# Patient Record
Sex: Male | Born: 1965 | Race: White | Hispanic: No | Marital: Married | Smoking: Former smoker
Health system: Southern US, Community
[De-identification: ages and names within clinical notes are randomized; demographics above are authoritative.]

## PROBLEM LIST (undated history)

## (undated) DIAGNOSIS — K219 Gastro-esophageal reflux disease without esophagitis: Secondary | ICD-10-CM

## (undated) DIAGNOSIS — F419 Anxiety disorder, unspecified: Secondary | ICD-10-CM

## (undated) DIAGNOSIS — F1910 Other psychoactive substance abuse, uncomplicated: Secondary | ICD-10-CM

## (undated) DIAGNOSIS — F32A Depression, unspecified: Secondary | ICD-10-CM

## (undated) DIAGNOSIS — Z8719 Personal history of other diseases of the digestive system: Secondary | ICD-10-CM

## (undated) HISTORY — PX: COLONOSCOPY: SHX174

## (undated) HISTORY — PX: FRACTURE SURGERY: SHX138

---

## 2015-01-04 ENCOUNTER — Other Ambulatory Visit: Payer: Self-pay | Admitting: Family Medicine

## 2015-01-04 DIAGNOSIS — D171 Benign lipomatous neoplasm of skin and subcutaneous tissue of trunk: Secondary | ICD-10-CM

## 2015-01-25 ENCOUNTER — Other Ambulatory Visit: Payer: Self-pay

## 2015-01-31 ENCOUNTER — Ambulatory Visit
Admission: RE | Admit: 2015-01-31 | Discharge: 2015-01-31 | Disposition: A | Discharge status: Home / Self Care | Payer: 59 | Source: Ambulatory Visit | Attending: Family Medicine | Admitting: Family Medicine

## 2015-01-31 DIAGNOSIS — D171 Benign lipomatous neoplasm of skin and subcutaneous tissue of trunk: Secondary | ICD-10-CM

## 2017-11-10 DIAGNOSIS — M189 Osteoarthritis of first carpometacarpal joint, unspecified: Secondary | ICD-10-CM | POA: Insufficient documentation

## 2018-06-30 ENCOUNTER — Encounter (HOSPITAL_COMMUNITY): Payer: Self-pay | Admitting: Emergency Medicine

## 2018-06-30 ENCOUNTER — Other Ambulatory Visit: Payer: Self-pay

## 2018-06-30 ENCOUNTER — Emergency Department (HOSPITAL_COMMUNITY)
Admission: EM | Admit: 2018-06-30 | Discharge: 2018-06-30 | Disposition: A | Discharge status: Home / Self Care | Payer: 59 | Attending: Emergency Medicine | Admitting: Emergency Medicine

## 2018-06-30 DIAGNOSIS — M7918 Myalgia, other site: Secondary | ICD-10-CM | POA: Insufficient documentation

## 2018-06-30 DIAGNOSIS — Y999 Unspecified external cause status: Secondary | ICD-10-CM | POA: Insufficient documentation

## 2018-06-30 DIAGNOSIS — Y9389 Activity, other specified: Secondary | ICD-10-CM | POA: Insufficient documentation

## 2018-06-30 DIAGNOSIS — Z87891 Personal history of nicotine dependence: Secondary | ICD-10-CM | POA: Diagnosis not present

## 2018-06-30 MED ORDER — IBUPROFEN 600 MG PO TABS: 600.0000 mg | ORAL_TABLET | Freq: Four times a day (QID) | ORAL | 0 refills | Status: DC | PRN

## 2018-06-30 MED ORDER — CYCLOBENZAPRINE HCL 10 MG PO TABS: 10.0000 mg | ORAL_TABLET | Freq: Two times a day (BID) | ORAL | 0 refills | Status: DC | PRN

## 2018-06-30 NOTE — ED Notes (Signed)
Bed: WTR7 Expected date:  Expected time:  Means of arrival:  Comments: 
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
## 2019-10-12 DIAGNOSIS — H93293 Other abnormal auditory perceptions, bilateral: Secondary | ICD-10-CM | POA: Insufficient documentation

## 2020-04-12 ENCOUNTER — Ambulatory Visit: Payer: No Typology Code available for payment source | Attending: Internal Medicine

## 2020-04-12 DIAGNOSIS — Z23 Encounter for immunization: Secondary | ICD-10-CM

## 2020-04-12 NOTE — Progress Notes (Signed)
   Covid-19 Vaccination Clinic  Name:  Johnny Mills    MRN: [id number] DOB: [birth year]  04/12/2020  Mr. Johnny Mills was observed post Covid-19 immunization for 15 minutes without incident. He was provided with Vaccine Information Sheet and instruction to access the V-Safe system.   Mr. Johnny Mills was instructed to call 911 with any severe reactions post vaccine: Marland Kitchen Difficulty breathing  . Swelling of face and throat  . A fast heartbeat  . A bad rash all over body  . Dizziness and weakness

## 2020-10-17 ENCOUNTER — Other Ambulatory Visit: Payer: Self-pay | Admitting: Gastroenterology

## 2020-10-17 DIAGNOSIS — R160 Hepatomegaly, not elsewhere classified: Secondary | ICD-10-CM

## 2020-11-01 ENCOUNTER — Other Ambulatory Visit: Payer: Self-pay

## 2020-11-01 ENCOUNTER — Ambulatory Visit
Admission: RE | Admit: 2020-11-01 | Discharge: 2020-11-01 | Disposition: A | Discharge status: Home / Self Care | Payer: BC Managed Care – PPO | Source: Ambulatory Visit | Attending: Gastroenterology | Admitting: Gastroenterology

## 2020-11-01 DIAGNOSIS — R160 Hepatomegaly, not elsewhere classified: Secondary | ICD-10-CM

## 2021-06-27 ENCOUNTER — Other Ambulatory Visit: Payer: Self-pay | Admitting: Gastroenterology

## 2021-06-27 DIAGNOSIS — R109 Unspecified abdominal pain: Secondary | ICD-10-CM

## 2021-06-27 DIAGNOSIS — K219 Gastro-esophageal reflux disease without esophagitis: Secondary | ICD-10-CM

## 2021-06-27 DIAGNOSIS — K449 Diaphragmatic hernia without obstruction or gangrene: Secondary | ICD-10-CM

## 2021-07-02 ENCOUNTER — Other Ambulatory Visit: Payer: BC Managed Care – PPO

## 2021-07-08 ENCOUNTER — Ambulatory Visit
Admission: RE | Admit: 2021-07-08 | Discharge: 2021-07-08 | Disposition: A | Discharge status: Home / Self Care | Payer: BC Managed Care – PPO | Source: Ambulatory Visit | Attending: Gastroenterology | Admitting: Gastroenterology

## 2021-07-08 DIAGNOSIS — K449 Diaphragmatic hernia without obstruction or gangrene: Secondary | ICD-10-CM

## 2021-07-08 DIAGNOSIS — R109 Unspecified abdominal pain: Secondary | ICD-10-CM

## 2021-07-08 DIAGNOSIS — K219 Gastro-esophageal reflux disease without esophagitis: Secondary | ICD-10-CM

## 2021-08-12 ENCOUNTER — Ambulatory Visit: Payer: Self-pay | Admitting: Surgery

## 2021-08-12 ENCOUNTER — Encounter: Payer: Self-pay | Admitting: Surgery

## 2021-08-12 DIAGNOSIS — F411 Generalized anxiety disorder: Secondary | ICD-10-CM | POA: Insufficient documentation

## 2021-08-12 DIAGNOSIS — F1920 Other psychoactive substance dependence, uncomplicated: Secondary | ICD-10-CM | POA: Insufficient documentation

## 2021-08-12 DIAGNOSIS — K219 Gastro-esophageal reflux disease without esophagitis: Secondary | ICD-10-CM | POA: Insufficient documentation

## 2021-08-12 DIAGNOSIS — R112 Nausea with vomiting, unspecified: Secondary | ICD-10-CM | POA: Insufficient documentation

## 2021-08-12 DIAGNOSIS — K440 Diaphragmatic hernia with obstruction, without gangrene: Secondary | ICD-10-CM | POA: Insufficient documentation

## 2021-08-12 DIAGNOSIS — K2270 Barrett's esophagus without dysplasia: Secondary | ICD-10-CM | POA: Insufficient documentation

## 2021-08-12 DIAGNOSIS — Z87891 Personal history of nicotine dependence: Secondary | ICD-10-CM | POA: Insufficient documentation

## 2021-08-12 DIAGNOSIS — E785 Hyperlipidemia, unspecified: Secondary | ICD-10-CM | POA: Insufficient documentation

## 2021-08-26 ENCOUNTER — Other Ambulatory Visit: Payer: Self-pay | Admitting: Gastroenterology

## 2021-09-18 ENCOUNTER — Encounter (HOSPITAL_COMMUNITY): Payer: Self-pay | Admitting: Gastroenterology

## 2021-09-18 NOTE — Progress Notes (Signed)
Attempted to obtain medical history via telephone, unable to reach at this time. I left a voicemail to return pre surgical testing department's phone call.  

## 2021-09-25 ENCOUNTER — Encounter (HOSPITAL_COMMUNITY): Admission: RE | Payer: Self-pay | Source: Home / Self Care

## 2021-09-25 ENCOUNTER — Ambulatory Visit (HOSPITAL_COMMUNITY)
Admission: RE | Admit: 2021-09-25 | Payer: BC Managed Care – PPO | Source: Home / Self Care | Admitting: Gastroenterology

## 2021-09-25 SURGERY — MANOMETRY, ESOPHAGUS
Anesthesia: Monitor Anesthesia Care | Laterality: Bilateral

## 2022-05-30 ENCOUNTER — Encounter (HOSPITAL_COMMUNITY): Payer: Self-pay

## 2022-05-30 ENCOUNTER — Other Ambulatory Visit: Payer: Self-pay

## 2022-05-30 ENCOUNTER — Emergency Department (HOSPITAL_COMMUNITY)
Admission: EM | Admit: 2022-05-30 | Discharge: 2022-05-30 | Disposition: A | Discharge status: Home / Self Care | Payer: 59 | Attending: Emergency Medicine | Admitting: Emergency Medicine

## 2022-05-30 DIAGNOSIS — I83892 Varicose veins of left lower extremities with other complications: Secondary | ICD-10-CM | POA: Diagnosis not present

## 2022-05-30 DIAGNOSIS — S81812A Laceration without foreign body, left lower leg, initial encounter: Secondary | ICD-10-CM | POA: Diagnosis not present

## 2022-05-30 DIAGNOSIS — S8992XA Unspecified injury of left lower leg, initial encounter: Secondary | ICD-10-CM | POA: Diagnosis present

## 2022-05-30 DIAGNOSIS — X58XXXA Exposure to other specified factors, initial encounter: Secondary | ICD-10-CM | POA: Diagnosis not present

## 2022-05-30 DIAGNOSIS — I83899 Varicose veins of unspecified lower extremities with other complications: Secondary | ICD-10-CM

## 2022-05-30 MED ORDER — LIDOCAINE-EPINEPHRINE (PF) 2 %-1:200000 IJ SOLN
10.0000 mL | Freq: Once | INTRAMUSCULAR | Status: AC
  Administered 2022-05-30: 10 mL
  Filled 2022-05-30: qty 20

## 2022-05-30 NOTE — Discharge Instructions (Addendum)
Suture removal in 7-8 days

## 2022-05-30 NOTE — ED Provider Triage Note (Signed)
Emergency Medicine Provider Triage Evaluation Note  Johnny Mills , a 56 y.o. male  was evaluated in triage.  Pt complains of ruptured vein.  Patient states he has multiple clusters of torturous veins in his lower legs.  Patient states he was toweling off after a shower when a vein close to the skin in his left lower leg just superior to the medial malleolus ruptured.  Patient wrapped the wound with a pressure dressing.  Upon unwrapping.  The wound is still actively bleeding.  Wound has been rewrapped with pressure dressing.  Patient does not take blood thinners.  Review of Systems  Positive: As above Negative: As above  Physical Exam  BP (!) 160/117 (BP Location: Right Arm)   Pulse (!) 104   Temp 98.3 F (36.8 C) (Oral)   Resp 18   Ht '5\' 11"'$  (1.803 m)   Wt 97.5 kg   SpO2 95%   BMI 29.99 kg/m  Gen:   Awake, no distress   Resp:  Normal effort  MSK:   Moves extremities without difficulty  Other:    Medical Decision Making  Medically screening exam initiated at 7:38 PM.  Appropriate orders placed.  Erique Kaser was informed that the remainder of the evaluation will be completed by another provider, this initial triage assessment does not replace that evaluation, and the importance of remaining in the ED until their evaluation is complete.     Dorothyann Peng, PA-C 05/30/22 1939

## 2022-05-30 NOTE — ED Provider Notes (Signed)
[facility] DEPT Provider Note   CSN: [id number] Arrival date & time: 05/30/22  1927     History  Chief Complaint  Patient presents with   Laceration    Johnny Mills is a 56 y.o. male.  Patient reports he was getting out of the shower and rubbed his leg with a towel.  Patient reports he has varicose veins in his lower leg and when he rubs a towel over his leg his leg began bleeding.  Patient reports he has been unable to get the area to stop bleeding at home.  Patient denies any previous history of varicose veins bleeding  The history is provided by the patient. No language interpreter was used.  Laceration Bleeding: uncontrolled   Laceration mechanism:  Unable to specify Pain details:    Quality:  Aching Relieved by:  Nothing Worsened by:  Nothing      Home Medications Prior to Admission medications   Medication Sig Start Date End Date Taking? Authorizing Provider  cyclobenzaprine (FLEXERIL) 10 MG tablet Take 1 tablet (10 mg total) by mouth 2 (two) times daily as needed for muscle spasms. 06/30/18   Domenic Moras, PA-C  ibuprofen (ADVIL,MOTRIN) 600 MG tablet Take 1 tablet (600 mg total) by mouth every 6 (six) hours as needed. 06/30/18   Domenic Moras, PA-C      Allergies    Duloxetine, Duloxetine hcl, Escitalopram, Fluoxetine, Penicillamine, Penicillins, and Sulfa antibiotics    Review of Systems   Review of Systems  All other systems reviewed and are negative.   Physical Exam Updated Vital Signs BP (!) 160/117 (BP Location: Right Arm)   Pulse (!) 104   Temp 98.3 F (36.8 C) (Oral)   Resp 18   Ht '5\' 11"'$  (1.803 m)   Wt 97.5 kg   SpO2 95%   BMI 29.99 kg/m  Physical Exam Vitals reviewed.  Constitutional:      Appearance: Normal appearance.  Skin:    Comments: Multiple varicose veins left lower leg.  Bleeding from small ruptured varicosity  Neurological:     General: No focal deficit present.     Mental Status: He is alert.   Psychiatric:        Mood and Affect: Mood normal.     ED Results / Procedures / Treatments   Labs (all labs ordered are listed, but only abnormal results are displayed) Labs Reviewed - No data to display  EKG None  Radiology No results found.  Procedures .Marland KitchenLaceration Repair  Date/Time: 05/30/2022 8:52 PM  Performed by: Fransico Meadow, PA-C Authorized by: Fransico Meadow, PA-C   Consent:    Consent obtained:  Verbal   Consent given by:  Patient   Risks, benefits, and alternatives were discussed: yes     Alternatives discussed:  No treatment Universal protocol:    Procedure explained and questions answered to patient or proxy's satisfaction: yes     Immediately prior to procedure, a time out was called: yes     Patient identity confirmed:  Verbally with patient Anesthesia:    Anesthesia method:  Local infiltration   Local anesthetic:  Lidocaine 1% WITH epi Laceration details:    Length (cm):  0.2 Pre-procedure details:    Preparation:  Patient was prepped and draped in usual sterile fashion Treatment:    Area cleansed with:  Povidone-iodine   Irrigation solution:  Sterile saline Skin repair:    Repair method:  Sutures   Suture size:  5-0   Suture  material:  Prolene   Suture technique:  Simple interrupted   Number of sutures: 1. Repair type:    Repair type:  Simple Post-procedure details:    Procedure completion:  Procedure terminated due to patient's clinical status     Medications Ordered in ED Medications  lidocaine-EPINEPHrine (XYLOCAINE W/EPI) 2 %-1:200000 (PF) injection 10 mL (10 mLs Infiltration Given by Other 05/30/22 2043)    ED Course/ Medical Decision Making/ A&P                           Medical Decision Making Risk Prescription drug management.           Final Clinical Impression(s) / ED Diagnoses Final diagnoses:  Ruptured varicosity    Rx / DC Orders ED Discharge Orders     None      An After Visit Summary was  printed and given to the patient.    Sidney Ace 05/30/22 2053    Drenda Freeze, MD 05/30/22 [phone]

## 2022-05-30 NOTE — ED Triage Notes (Signed)
Left medial ankle laceration after drying off with towel and showering.   Says a cluster of veins just bursted and unable to control bleeding.   Denies anticoagulants.  Dressing unwrapped in triage and immediate heavy bleeding. Unable to properly visualize laceration.

## 2023-11-08 ENCOUNTER — Emergency Department (HOSPITAL_BASED_OUTPATIENT_CLINIC_OR_DEPARTMENT_OTHER): Admitting: Radiology

## 2023-11-08 ENCOUNTER — Observation Stay (HOSPITAL_BASED_OUTPATIENT_CLINIC_OR_DEPARTMENT_OTHER)
Admission: EM | Admit: 2023-11-08 | Discharge: 2023-11-10 | Disposition: A | Discharge status: Home / Self Care | Attending: Internal Medicine | Admitting: Internal Medicine

## 2023-11-08 ENCOUNTER — Encounter (HOSPITAL_BASED_OUTPATIENT_CLINIC_OR_DEPARTMENT_OTHER): Payer: Self-pay | Admitting: Emergency Medicine

## 2023-11-08 ENCOUNTER — Other Ambulatory Visit: Payer: Self-pay

## 2023-11-08 DIAGNOSIS — K920 Hematemesis: Secondary | ICD-10-CM | POA: Diagnosis present

## 2023-11-08 DIAGNOSIS — R1111 Vomiting without nausea: Secondary | ICD-10-CM | POA: Diagnosis not present

## 2023-11-08 DIAGNOSIS — Z7982 Long term (current) use of aspirin: Secondary | ICD-10-CM | POA: Insufficient documentation

## 2023-11-08 DIAGNOSIS — K449 Diaphragmatic hernia without obstruction or gangrene: Secondary | ICD-10-CM | POA: Diagnosis not present

## 2023-11-08 DIAGNOSIS — K922 Gastrointestinal hemorrhage, unspecified: Secondary | ICD-10-CM | POA: Diagnosis not present

## 2023-11-08 DIAGNOSIS — K317 Polyp of stomach and duodenum: Secondary | ICD-10-CM | POA: Insufficient documentation

## 2023-11-08 DIAGNOSIS — K2270 Barrett's esophagus without dysplasia: Secondary | ICD-10-CM | POA: Diagnosis present

## 2023-11-08 DIAGNOSIS — R000 Tachycardia, unspecified: Secondary | ICD-10-CM | POA: Diagnosis not present

## 2023-11-08 DIAGNOSIS — Z79899 Other long term (current) drug therapy: Secondary | ICD-10-CM | POA: Diagnosis not present

## 2023-11-08 DIAGNOSIS — E860 Dehydration: Secondary | ICD-10-CM | POA: Diagnosis not present

## 2023-11-08 DIAGNOSIS — K219 Gastro-esophageal reflux disease without esophagitis: Secondary | ICD-10-CM | POA: Diagnosis present

## 2023-11-08 DIAGNOSIS — Z7401 Bed confinement status: Secondary | ICD-10-CM | POA: Diagnosis not present

## 2023-11-08 LAB — ABO/RH: ABO/RH(D): A NEG

## 2023-11-08 LAB — COMPREHENSIVE METABOLIC PANEL WITH GFR
ALT: 26 U/L (ref 0–44)
AST: 22 U/L (ref 15–41)
Albumin: 4.5 g/dL (ref 3.5–5.0)
Alkaline Phosphatase: 98 U/L (ref 38–126)
Anion gap: 15 (ref 5–15)
BUN: 19 mg/dL (ref 6–20)
CO2: 28 mmol/L (ref 22–32)
Calcium: 10.8 mg/dL — ABNORMAL HIGH (ref 8.9–10.3)
Chloride: 102 mmol/L (ref 98–111)
Creatinine, Ser: 1.06 mg/dL (ref 0.61–1.24)
GFR, Estimated: >60 mL/min (ref >60)
Glucose, Bld: 139 mg/dL — ABNORMAL HIGH (ref 70–99)
Potassium: 3.9 mmol/L (ref 3.5–5.1)
Sodium: 144 mmol/L (ref 135–145)
Total Bilirubin: 0.6 mg/dL (ref 0.0–1.2)
Total Protein: 7.8 g/dL (ref 6.5–8.1)

## 2023-11-08 LAB — URINALYSIS, ROUTINE W REFLEX MICROSCOPIC
Bilirubin Urine: NEGATIVE
Glucose, UA: NEGATIVE mg/dL
Hgb urine dipstick: NEGATIVE
Ketones, ur: 40 mg/dL — AB
Leukocytes,Ua: NEGATIVE
Nitrite: NEGATIVE
Protein, ur: 30 mg/dL — AB
Specific Gravity, Urine: 1.023 (ref 1.005–1.030)
pH: 8.5 — ABNORMAL HIGH (ref 5.0–8.0)

## 2023-11-08 LAB — CBC
HCT: 44.7 % (ref 39.0–52.0)
Hemoglobin: 15.2 g/dL (ref 13.0–17.0)
MCH: 30.9 pg (ref 26.0–34.0)
MCHC: 34 g/dL (ref 30.0–36.0)
MCV: 90.9 fL (ref 80.0–100.0)
Platelets: 325 10*3/uL (ref 150–400)
RBC: 4.92 MIL/uL (ref 4.22–5.81)
RDW: 12.8 % (ref 11.5–15.5)
WBC: 8.3 10*3/uL (ref 4.0–10.5)
nRBC: 0 % (ref 0.0–0.2)

## 2023-11-08 LAB — LIPASE, BLOOD: Lipase: 27 U/L (ref 11–51)

## 2023-11-08 MED ORDER — ONDANSETRON HCL 4 MG/2ML IJ SOLN
4.0000 mg | Freq: Once | INTRAMUSCULAR | Status: AC
  Administered 2023-11-08: 4 mg via INTRAVENOUS
  Filled 2023-11-08: qty 2

## 2023-11-08 MED ORDER — DEXTROSE-SODIUM CHLORIDE 5-0.9 % IV SOLN: INTRAVENOUS | Status: AC

## 2023-11-08 MED ORDER — SODIUM CHLORIDE 0.9 % IV BOLUS
1000.0000 mL | Freq: Once | INTRAVENOUS | Status: AC
  Administered 2023-11-08: 1000 mL via INTRAVENOUS

## 2023-11-08 MED ORDER — PANTOPRAZOLE SODIUM 40 MG IV SOLR
40.0000 mg | Freq: Once | INTRAVENOUS | Status: AC
  Administered 2023-11-08: 40 mg via INTRAVENOUS
  Filled 2023-11-08: qty 10

## 2023-11-08 MED ORDER — SODIUM CHLORIDE 0.9% FLUSH
3.0000 mL | Freq: Two times a day (BID) | INTRAVENOUS | Status: DC
  Administered 2023-11-08 – 2023-11-10 (×3): 3 mL via INTRAVENOUS

## 2023-11-08 MED ORDER — BUPROPION HCL ER (XL) 150 MG PO TB24
150.0000 mg | ORAL_TABLET | Freq: Every day | ORAL | Status: DC
  Administered 2023-11-09 – 2023-11-10 (×2): 150 mg via ORAL
  Filled 2023-11-08 (×2): qty 1

## 2023-11-08 MED ORDER — PANTOPRAZOLE SODIUM 40 MG IV SOLR
40.0000 mg | Freq: Two times a day (BID) | INTRAVENOUS | Status: DC
  Administered 2023-11-09 – 2023-11-10 (×3): 40 mg via INTRAVENOUS
  Filled 2023-11-08 (×3): qty 10

## 2023-11-08 NOTE — ED Notes (Signed)
 Pt ambulatory to the bathroom

## 2023-11-08 NOTE — ED Triage Notes (Signed)
 Black, coffee ground emesis several episodes. Started this AM. HX EtOH abuse in the past. Denies upper CP, SOB, and diarrhea. Some light-headed feelings.

## 2023-11-08 NOTE — ED Provider Notes (Signed)
  EMERGENCY DEPARTMENT AT [facility] Provider Note   CSN: [id number] Arrival date & time: 11/08/23  1715     History  Chief Complaint  Patient presents with   Hematemesis    Johnny Mills is a 58 y.o. male with history of hiatal hernia, presents with concern for throwing up black appearing emesis earlier today.  States this happened about 4 or 5 times, prompting him to come in for evaluation.  He does report feeling slightly lightheaded and dizzy.  He is not on any anticoagulation.  He denies any alcohol use or liver problems.  States this started after eating barbecue yesterday evening which did not sit well on his stomach, so he forced himself to throw up a couple times.  Emesis last night did not contain any blood.  Denies any recent NSAID use.  HPI     Home Medications Prior to Admission medications   Medication Sig Start Date End Date Taking? Authorizing Provider  cyclobenzaprine  (FLEXERIL ) 10 MG tablet Take 1 tablet (10 mg total) by mouth 2 (two) times daily as needed for muscle spasms. 06/30/18   Debbra Fairy, PA-C  ibuprofen  (ADVIL ,MOTRIN ) 600 MG tablet Take 1 tablet (600 mg total) by mouth every 6 (six) hours as needed. 06/30/18   Debbra Fairy, PA-C      Allergies    Duloxetine, Duloxetine hcl, Escitalopram, Fluoxetine, Penicillamine, Penicillins, and Sulfa antibiotics    Review of Systems   Review of Systems  Gastrointestinal:  Positive for abdominal pain and vomiting.    Physical Exam Updated Vital Signs BP (!) 144/89   Pulse 70   Temp 98 F (36.7 C)   Resp 12   SpO2 94%  Physical Exam Vitals and nursing note reviewed.  Constitutional:      General: He is not in acute distress.    Appearance: He is well-developed.     Comments: No active vomiting upon my evaluation of patient  HENT:     Head: Normocephalic and atraumatic.  Eyes:     Conjunctiva/sclera: Conjunctivae normal.  Cardiovascular:     Rate and Rhythm: Normal rate and regular  rhythm.     Heart sounds: No murmur heard. Pulmonary:     Effort: Pulmonary effort is normal. No respiratory distress.     Breath sounds: Normal breath sounds.  Abdominal:     Palpations: Abdomen is soft.     Tenderness: There is no abdominal tenderness.  Musculoskeletal:        General: No swelling.     Cervical back: Neck supple.  Skin:    General: Skin is warm and dry.     Capillary Refill: Capillary refill takes less than 2 seconds.  Neurological:     Mental Status: He is alert.  Psychiatric:        Mood and Affect: Mood normal.     ED Results / Procedures / Treatments   Labs (all labs ordered are listed, but only abnormal results are displayed) Labs Reviewed  COMPREHENSIVE METABOLIC PANEL WITH GFR - Abnormal; Notable for the following components:      Result Value   Glucose, Bld 139 (*)    Calcium 10.8 (*)    All other components within normal limits  URINALYSIS, ROUTINE W REFLEX MICROSCOPIC - Abnormal; Notable for the following components:   APPearance HAZY (*)    pH 8.5 (*)    Ketones, ur 40 (*)    Protein, ur 30 (*)    Bacteria, UA RARE (*)  All other components within normal limits  LIPASE, BLOOD  CBC  ABO/RH    EKG None  Radiology DG Chest 2 View Result Date: 11/08/2023 CLINICAL DATA:  Hematemesis EXAM: CHEST - 2 VIEW COMPARISON:  None Available. FINDINGS: Large hiatal hernia with internal fluid levels. The lungs are clear. Normal heart silhouette. No pleural effusion or pneumothorax. IMPRESSION: Large hiatal hernia with internal fluid levels. Electronically Signed   By: Juanetta Nordmann M.D.   On: 11/08/2023 20:44    Procedures Procedures    Medications Ordered in ED Medications  ondansetron (ZOFRAN) injection 4 mg (4 mg Intravenous Given 11/08/23 1757)  pantoprazole (PROTONIX) injection 40 mg (40 mg Intravenous Given 11/08/23 1802)    ED Course/ Medical Decision Making/ A&P                                 Medical Decision Making Amount and/or  Complexity of Data Reviewed Labs: ordered. Radiology: ordered.  Risk Prescription drug management. Decision regarding hospitalization.     Differential diagnosis includes but is not limited to peptic ulcer, Mallory-Weiss tear, variceal bleed, gastritis, blood loss anemia  ED Course:  Upon initial evaluation, patient is well-appearing, stable vitals.  He reported 5-6 episodes of black appearing emesis prior to arrival.  No active vomiting here but he does report nausea.  Reporting some epigastric abdominal pain, but abdomen soft and nontender to palpation.  No rebound or guarding. No recent alcohol use or history of varices, no bright red blood or active bleeding, no concern for variceal bleed at this time  Labs Ordered: I Ordered, and personally interpreted labs.  The pertinent results include:   CBC within normal limits.  Hemoglobin 15.2 CMP unremarkable.  Normal creatinine and LFTs Base within normal limits Urinalysis no nitrates or leukocytes  Imaging Studies ordered: I ordered imaging studies including chest x-ray I independently visualized the imaging with scope of interpretation limited to determining acute life threatening conditions related to emergency care. Imaging showed large hiatal hernia with internal fluid levels I agree with the radiologist interpretation   Cardiac Monitoring: / EKG: The patient was maintained on a cardiac monitor.  I personally viewed and interpreted the cardiac monitored which showed an underlying rhythm of: Normal sinus rhythm   Consultations Obtained: I requested consultation with the gastroenterologist Dr. Honey Lusty,  and discussed lab and imaging findings as well as pertinent plan - they recommend: Continuing patient on Protonix.  Recommend chest x-ray.  They will plan to see patient in consult at [facility] in the morning  Medications Given: Protonix Zofran  Upon re-evaluation, patient well-appearing with stable vitals. Hemoglobin  remains within normal limits, no concern for acute blood loss anemia. Reports he has had 2 more episodes of black appearing hematemesis here in the emergency room. Will plan on admission for patient with GI to consult in the morning. Stable at this time for transfer    Impression: Hematemesis  Disposition:  Admission with hospitalist Dr. [facility]    Record Review: External records from outside source obtained and reviewed including general surgery consult for hiatal hernia in 2023.  Does not appear that he follows with GI     This chart was dictated using voice recognition software, Dragon. Despite the best efforts of this provider to proofread and correct errors, errors may still occur which can change documentation meaning.          Final Clinical Impression(s) / ED Diagnoses  Final diagnoses:  None    Rx / DC Orders ED Discharge Orders     None         Rexie Catena, PA-C 11/08/23 2143    Hershel Los, MD 11/09/23 [phone]

## 2023-11-09 DIAGNOSIS — E860 Dehydration: Secondary | ICD-10-CM | POA: Insufficient documentation

## 2023-11-09 DIAGNOSIS — K922 Gastrointestinal hemorrhage, unspecified: Secondary | ICD-10-CM | POA: Diagnosis not present

## 2023-11-09 LAB — CBC
HCT: 41.1 % (ref 39.0–52.0)
Hemoglobin: 12.9 g/dL — ABNORMAL LOW (ref 13.0–17.0)
MCH: 30.3 pg (ref 26.0–34.0)
MCHC: 31.4 g/dL (ref 30.0–36.0)
MCV: 96.5 fL (ref 80.0–100.0)
Platelets: 237 10*3/uL (ref 150–400)
RBC: 4.26 MIL/uL (ref 4.22–5.81)
RDW: 12.8 % (ref 11.5–15.5)
WBC: 5.7 10*3/uL (ref 4.0–10.5)
nRBC: 0 % (ref 0.0–0.2)

## 2023-11-09 LAB — BASIC METABOLIC PANEL WITH GFR
Anion gap: 8 (ref 5–15)
BUN: 26 mg/dL — ABNORMAL HIGH (ref 6–20)
CO2: 26 mmol/L (ref 22–32)
Calcium: 9 mg/dL (ref 8.9–10.3)
Chloride: 105 mmol/L (ref 98–111)
Creatinine, Ser: 0.82 mg/dL (ref 0.61–1.24)
GFR, Estimated: >60 mL/min (ref >60)
Glucose, Bld: 125 mg/dL — ABNORMAL HIGH (ref 70–99)
Potassium: 4.2 mmol/L (ref 3.5–5.1)
Sodium: 139 mmol/L (ref 135–145)

## 2023-11-09 LAB — TYPE AND SCREEN
ABO/RH(D): A NEG
Antibody Screen: NEGATIVE

## 2023-11-09 LAB — PROTIME-INR
INR: 1.1 (ref 0.8–1.2)
Prothrombin Time: 14.7 s (ref 11.4–15.2)

## 2023-11-09 LAB — PHOSPHORUS: Phosphorus: 4.1 mg/dL (ref 2.5–4.6)

## 2023-11-09 LAB — MAGNESIUM: Magnesium: 2 mg/dL (ref 1.7–2.4)

## 2023-11-09 LAB — HIV ANTIBODY (ROUTINE TESTING W REFLEX): HIV Screen 4th Generation wRfx: NONREACTIVE

## 2023-11-09 NOTE — Anesthesia Preprocedure Evaluation (Signed)
 Anesthesia Evaluation  Patient identified by MRN, date of birth, ID band Patient awake    Reviewed: Allergy & Precautions, NPO status , Patient's Chart, lab work & pertinent test results  History of Anesthesia Complications Negative for: history of anesthetic complications  Airway Mallampati: I  TM Distance: >3 FB Neck ROM: Full    Dental no notable dental hx. (+) Teeth Intact, Dental Advisory Given   Pulmonary neg pulmonary ROS, former smoker   Pulmonary exam normal breath sounds clear to auscultation       Cardiovascular (-) hypertension(-) angina (-) Past MI negative cardio ROS Normal cardiovascular exam Rhythm:Regular Rate:Normal     Neuro/Psych  PSYCHIATRIC DISORDERS Anxiety        GI/Hepatic Neg liver ROS, hiatal hernia,GERD  Medicated and Controlled,,  Endo/Other  negative endocrine ROS    Renal/GU Lab Results      Component                Value               Date                             K                        4.2                 11/09/2023                CO2                      26                  11/09/2023                BUN                      26 (H)              11/09/2023                CREATININE               0.82                11/09/2023                    Musculoskeletal  (+) Arthritis ,    Abdominal   Peds  Hematology Lab Results      Component                Value               Date                      WBC                      5.7                 11/09/2023                HGB                      12.9 (L)            11/09/2023  HCT                      41.1                11/09/2023                MCV                      96.5                11/09/2023                PLT                      237                 11/09/2023              Anesthesia Other Findings All: Duloxetine, fluoxetine, Pcn, Sulfa, Escitalopram  Reproductive/Obstetrics                              Anesthesia Physical Anesthesia Plan  ASA: 2  Anesthesia Plan: MAC   Post-op Pain Management: Minimal or no pain anticipated   Induction:   PONV Risk Score and Plan: Treatment may vary due to age or medical condition and Propofol infusion  Airway Management Planned: Natural Airway and Nasal Cannula  Additional Equipment: None  Intra-op Plan:   Post-operative Plan:   Informed Consent: I have reviewed the patients History and Physical, chart, labs and discussed the procedure including the risks, benefits and alternatives for the proposed anesthesia with the patient or authorized representative who has indicated his/her understanding and acceptance.     Dental advisory given  Plan Discussed with: CRNA and Surgeon  Anesthesia Plan Comments: (Coffee ground emesis)       Anesthesia Quick Evaluation

## 2023-11-09 NOTE — Consult Note (Signed)
 [facility] Consult  Referring Provider: [facility] Primary Care Physician:  [facility] [place] Primary Gastroenterologist: Dr. Kimble Pennant  Reason for Consultation: Coffee-ground emesis  HPI: Johnny Mills is a 58 y.o. male with history of hiatal hernia and Barrett's esophagus who [facility] with nausea, vomiting and coffee-ground emesis.  Patient states he has multiple intermittent episodes of" gastritis", which she describes as epigastric and lower sternal pressure which improves with nausea and vomiting however he has never had vomiting of coffee-ground material. He denies noticing blood in stool or black stools. He denies acid reflux or heartburn. Denies difficulty swallowing or pain on swallowing. He is unintentional weight loss or loss of appetite. He takes pantoprazole 40 mg once a day. He also takes aspirin 81 mg a day but denies use of other NSAIDs, antiplatelets, Goody powders.   History reviewed. No pertinent past medical history.  Past Surgical History:  Procedure Laterality Date   FRACTURE SURGERY      Prior to Admission medications   Medication Sig Start Date End Date Taking? Authorizing Provider  aspirin EC 81 MG tablet Take 81 mg by mouth daily. Swallow whole.   Yes [provider]  buPROPion (WELLBUTRIN XL) 150 MG 24 hr tablet Take 150 mg by mouth every morning. 09/28/23  Yes [provider]  folic acid (FOLVITE) 1 MG tablet Take 1 mg by mouth daily.   Yes [provider]  L-ARGININE PO Take 1 Dose by mouth daily.   Yes [provider]  meloxicam (MOBIC) 15 MG tablet Take 15 mg by mouth daily. 10/05/23  Yes [provider]  Multiple Vitamins-Minerals (ZINC PO) Take 1 tablet by mouth daily.   Yes [provider]  naproxen sodium (ALEVE) 220 MG tablet Take 220-440 mg by mouth 2 (two) times daily as needed (Pain).   Yes [provider]  pantoprazole  (PROTONIX) 40 MG tablet Take 40 mg by mouth daily.   Yes [provider]  Saw Palmetto, Serenoa repens, (SAW PALMETTO PO) Take 1 Dose by mouth daily.   Yes [provider]    Current Facility-Administered Medications  Medication Dose Route Frequency Provider Last Rate Last Admin   buPROPion (WELLBUTRIN XL) 24 hr tablet 150 mg  150 mg Oral Daily Segars, Jonathan, MD       pantoprazole (PROTONIX) injection 40 mg  40 mg Intravenous Q12H Segars, Jonathan, MD   40 mg at 11/09/23 0536   sodium chloride flush (NS) 0.9 % injection 3 mL  3 mL Intravenous Q12H Arnulfo Larch, MD   3 mL at 11/08/23 2303    Allergies as of 11/08/2023 - Review Complete 11/08/2023  Allergen Reaction Noted   Duloxetine Other (See Comments) 06/27/2021   Duloxetine hcl Other (See Comments) 06/27/2021   Escitalopram Other (See Comments) 06/27/2021   Fluoxetine Other (See Comments) 06/27/2021   Penicillamine Other (See Comments) 08/12/2021   Penicillins Hives and Other (See Comments) 06/30/2018   Sulfa antibiotics Hives 10/12/2019    History reviewed. No pertinent family history.  Social History   Socioeconomic History   Marital status: Married    Spouse name: Not on file   Number of children: Not on file   Years of education: Not on file   Highest education level: Not on file  Occupational History   Not on file  Tobacco Use   Smoking status: Former   Smokeless tobacco: Never  Vaping Use   Vaping status: Never Used  Substance  and Sexual Activity   Alcohol use: Not on file   Drug use: Not on file   Sexual activity: Not on file  Other Topics Concern   Not on file  Social History Narrative   Not on file   Social Drivers of Health   Financial Resource Strain: Not on file  Food Insecurity: No Food Insecurity (11/08/2023)   Hunger Vital Sign    Worried About Running Out of Food in the Last Year: Never true    Ran Out of Food in the Last Year: Never true  Transportation Needs: No  Transportation Needs (11/08/2023)   PRAPARE - Administrator, Civil Service (Medical): No    Lack of Transportation (Non-Medical): No  Physical Activity: Not on file  Stress: Not on file  Social Connections: Not on file  Intimate Partner Violence: Not At Risk (11/08/2023)   Humiliation, Afraid, Rape, and Kick questionnaire    Fear of Current or Ex-Partner: No    Emotionally Abused: No    Physically Abused: No    Sexually Abused: No    Review of Systems: As per HPI.  Physical Exam: Vital signs in last 24 hours: Temp:  [98 F (36.7 C)-98.4 F (36.9 C)] 98 F (36.7 C) (06/09 1328) Pulse Rate:  [52-102] 63 (06/09 1328) Resp:  [10-20] 18 (06/09 1328) BP: (109-149)/(69-103) 114/70 (06/09 1328) SpO2:  [90 %-96 %] 94 % (06/09 1328) Weight:  [92.5 kg] 92.5 kg (06/08 2242) Last BM Date :  (PTA)  General:   Alert,  Well-developed, well-nourished, pleasant and cooperative in NAD Head:  Normocephalic and atraumatic. Eyes:  Sclera clear, no icterus.   Conjunctiva pink. Ears:  Normal auditory acuity. Nose:  No deformity, discharge,  or lesions. Mouth:  No deformity or lesions.  Oropharynx pink & moist. Neck:  Supple; no masses or thyromegaly. Lungs:  Clear throughout to auscultation.   No wheezes, crackles, or rhonchi. No acute distress. Heart:  Regular rate and rhythm; no murmurs, clicks, rubs,  or gallops. Extremities:  Without clubbing or edema. Neurologic:  Alert and  oriented x4;  grossly normal neurologically. Skin:  Intact without significant lesions or rashes. Psych:  Alert and cooperative. Normal mood and affect. Abdomen:  Soft, nontender and nondistended. No masses, hepatosplenomegaly or hernias noted. Normal bowel sounds, without guarding, and without rebound.         Lab Results: Recent Labs    11/08/23 1742 11/09/23 0547  WBC 8.3 5.7  HGB 15.2 12.9*  HCT 44.7 41.1  PLT 325 237   BMET Recent Labs    11/08/23 1742 11/09/23 0547  NA 144 139  K 3.9 4.2   CL 102 105  CO2 28 26  GLUCOSE 139* 125*  BUN 19 26*  CREATININE 1.06 0.82  CALCIUM 10.8* 9.0   LFT Recent Labs    11/08/23 1742  PROT 7.8  ALBUMIN 4.5  AST 22  ALT 26  ALKPHOS 98  BILITOT 0.6   PT/INR Recent Labs    11/09/23 0547  LABPROT 14.7  INR 1.1    Studies/Results: DG Chest 2 View Result Date: 11/08/2023 CLINICAL DATA:  Hematemesis EXAM: CHEST - 2 VIEW COMPARISON:  None Available. FINDINGS: Large hiatal hernia with internal fluid levels. The lungs are clear. Normal heart silhouette. No pleural effusion or pneumothorax. IMPRESSION: Large hiatal hernia with internal fluid levels. Electronically Signed   By: Juanetta Nordmann M.D.   On: 11/08/2023 20:44    Impression: Coffee-ground emesis Elevated BUN/creatinine ratio  of 26/0.82 Hemoglobin 12.9, was 15.2 on admission History of large hiatal hernia  Plan: Diagnostic EGD in a.m.Aaron Aas Clear liquid diet today, n.p.o. postmidnight. Continue pantoprazole 40 mg every 12 hours for now. He is no longer nauseous and has not needed Zofran since yesterday evening.   LOS: 0 days   Genell Ken, MD  11/09/2023, 2:35 PM

## 2023-11-09 NOTE — Plan of Care (Signed)
   Problem: Clinical Measurements: Goal: Ability to maintain clinical measurements within normal limits will improve Outcome: Progressing Goal: Will remain free from infection Outcome: Progressing Goal: Diagnostic test results will improve Outcome: Progressing   Problem: Activity: Goal: Risk for activity intolerance will decrease Outcome: Progressing

## 2023-11-09 NOTE — Progress Notes (Signed)
  [facility] Progress Note  Patient: Johnny Mills     WGN:[id number]  DOA: 11/08/2023   PCP: [facility] [place]       Brief hospital course: 58 y/o male with hiatal hernia, Barrett's esophagus, obesity who has bouts of vomiting every few months. He states rich food and eating too much trigger them. This time he ate at a barbecue and developed vomiting. He eventually developed hematemesis and came to the ED.   Subjective:  4 episodes of bloody vomitus since yesterday but none today. No abdominal pain.   Assessment and Plan: Principal Problem:   Upper GI bleed - h/o Barretts, Hiatal hernia and intermittent Meloxicam use - started as vomiting w/o blood but then had 4 episodes of bloody vomitus - Protonix IV BID - GI consulted- plan for EGD tomorrow  Active Problems:   Dehydration  - given 1 L NS and then D5NS at 75 cc/hr - is not on clear liquid diet. Will hold IVF.   Body mass index is 28.44 kg/m.     Code Status: Full Code Total time on patient care: 35 min DVT prophylaxis:  SCDs Start: 11/08/23 2244     Objective:   Vitals:   11/08/23 2242 11/09/23 0520 11/09/23 1025 11/09/23 1328  BP:  109/69 122/79 114/70  Pulse:  61 (!) 52 63  Resp:  16 20 18   Temp:  98 F (36.7 C) 98 F (36.7 C) 98 F (36.7 C)  TempSrc:  Oral Oral Oral  SpO2:  91% 94% 94%  Weight: 92.5 kg     Height: 5\' 11"  (1.803 m)      Filed Weights   11/08/23 2242  Weight: 92.5 kg   Exam: General exam: Appears comfortable  HEENT: oral mucosa moist Respiratory system: Clear to auscultation.  Cardiovascular system: S1 & S2 heard  Gastrointestinal system: Abdomen soft, mild epigastric tenderness, nondistended. Normal bowel sounds   Extremities: No cyanosis, clubbing or edema Psychiatry:  Mood & affect appropriate.      CBC: Recent Labs  Lab 11/08/23 1742 11/09/23 0547  WBC 8.3 5.7  HGB 15.2 12.9*  HCT 44.7 41.1  MCV 90.9 96.5  PLT 325 237   Basic  Metabolic Panel: Recent Labs  Lab 11/08/23 1742 11/09/23 0547  NA 144 139  K 3.9 4.2  CL 102 105  CO2 28 26  GLUCOSE 139* 125*  BUN 19 26*  CREATININE 1.06 0.82  CALCIUM 10.8* 9.0  MG  --  2.0  PHOS  --  4.1     Scheduled Meds:  buPROPion  150 mg Oral Daily   pantoprazole (PROTONIX) IV  40 mg Intravenous Q12H   sodium chloride flush  3 mL Intravenous Q12H    Imaging and lab data personally reviewed   Author: Jann Milkovich  11/09/2023 4:58 PM  To contact [facility]>   Check the care team in [facility] and look for the attending/consulting [facility] provider listed  Log into [email or web address] and use [facility]'s universal password   Go to> "[facility]"  and find provider  If you still have difficulty reaching the provider, please page the [facility] (Director on Call) for the Hospitalists listed on amion

## 2023-11-09 NOTE — H&P (Addendum)
 History and Physical    Lindley Hiney ZOX:[id number] DOB: Sep 11, 1965 DOA: 11/08/2023  PCP: [facility] [place]   Patient coming from: Transfer from [facility] ED   Chief Complaint:  Chief Complaint  Patient presents with   Hematemesis    HPI:  Johnny Mills is a 58 y.o. male with hx of hiatal hernia, Barrett's esophagus, who is transferred from [facility] ED with upper GI bleeding.  Patient reports yesterday evening he was at a barbecue and he ate some food which did not agree with him, developed nausea and vomiting.  Reports with his hiatal hernia he often has to vomit multiple times to completely clear his stomach.  He vomited about 5-6 times before developing black coffee-ground material in his emesis but continued vomiting.  In total thinks he vomited about 15 times.  No bright red blood, no melena.  Denies prior episodes of upper GI bleed.  His last endoscopy was in 3/' 23 done at outside facility and per notes demonstrated a large paraesophageal hiatal hernia, Barrett's esophagus.  He takes pantoprazole 40 mg daily for reflux/Barrett's.  Does take intermittent NSAIDs and has a prescription for meloxicam.  Takes aspirin 81 mg daily for primary prevention.  Takes a number of supplements including saw palmetto.   Review of Systems:  ROS complete and negative except as marked above   Allergies  Allergen Reactions   Duloxetine Other (See Comments)   Duloxetine Hcl Other (See Comments)    fatigue   Escitalopram Other (See Comments)   Fluoxetine Other (See Comments)    fatigue   Penicillamine Other (See Comments)   Penicillins Hives and Other (See Comments)   Sulfa Antibiotics Hives    Prior to Admission medications   Medication Sig Start Date End Date Taking? Authorizing Provider  cyclobenzaprine  (FLEXERIL ) 10 MG tablet Take 1 tablet (10 mg total) by mouth 2 (two) times daily as needed for muscle spasms. 06/30/18   Debbra Fairy, PA-C  ibuprofen  (ADVIL ,MOTRIN ) 600 MG tablet  Take 1 tablet (600 mg total) by mouth every 6 (six) hours as needed. 06/30/18   Debbra Fairy, PA-C    History reviewed. No pertinent past medical history.  Past Surgical History:  Procedure Laterality Date   FRACTURE SURGERY       reports that he has quit smoking. He has never used smokeless tobacco. No history on file for alcohol use and drug use.  History reviewed. No pertinent family history.   Physical Exam: Vitals:   11/08/23 2200 11/08/23 2210 11/08/23 2237 11/08/23 2242  BP: 139/84  (!) 149/96   Pulse: 72  81   Resp: 19  16   Temp:   98.3 F (36.8 C)   TempSrc:   Oral   SpO2: 90% 94% 91%   Weight:    92.5 kg  Height:    5\' 11"  (1.803 m)    Gen: Awake, alert, NAD   CV: Regular, normal S1, S2, no murmurs  Resp: Normal WOB, CTAB  Abd: Flat, normoactive, nontender, mild epigastric tenderness MSK: Symmetric, no edema  Skin: No rashes or lesions to exposed skin  Neuro: Alert and interactive  Psych: euthymic, appropriate    Data review:   Labs reviewed, notable for:   Hemoglobin 15 UA positive ketones  Micro:  No results found for this or any previous visit.  Imaging reviewed:  DG Chest 2 View Result Date: 11/08/2023 CLINICAL DATA:  Hematemesis EXAM: CHEST - 2 VIEW COMPARISON:  None Available. FINDINGS: Large hiatal  hernia with internal fluid levels. The lungs are clear. Normal heart silhouette. No pleural effusion or pneumothorax. IMPRESSION: Large hiatal hernia with internal fluid levels. Electronically Signed   By: Juanetta Nordmann M.D.   On: 11/08/2023 20:44    Personally reviewed chest x-ray   ED Course:  Outside EDP discussed case with [facility] Dr. Honey Lusty, [facility] to see in AM.  Treated with pantoprazole IV, Zofran   Assessment/Plan:  58 y.o. male with hx hiatal hernia, Barrett's esophagus, who is transferred from [facility] ED with upper GI bleeding.   Upper GI bleed Acute onset of coffee-ground emesis after multiple bouts of vomiting.  Takes aspirin  for primary prophylaxis and occasional NSAIDs.  Takes saw palmetto.  Hemodynamically stable.  Initial hemoglobin 15.  Etiology of bleed possibly gastritis/PUD with his aspirin/NSAID use, hiatal hernia, possible Mallory-Weiss with onset after vomiting.   - Outside EDP discussed case with [facility] Dr. Honey Lusty, [facility] to see in AM. - N.p.o. after midnight  - Continue pantoprazole 40 mg IV twice daily - Trend CBC, check coags, type and screen in a.m. - Advised to discontinue aspirin since used for primary prophylaxis - Advised to avoid NSAID medications - Doubt saw palmetto contributory but would advise on potential increased risk of bleeding  Dehydration Suspect fasting ketosis -Give 1 L NS, continue D5 NS 75 cc an hour after  Chronic medical problems: Hiatal hernia, Barrett's: Will be assessed on endoscopy, will need follow-up with Barrett's outpatient. Mood disorder: Continue home bupropion.   Body mass index is 28.44 kg/m.    DVT prophylaxis:  SCDs Code Status:  Full Code Diet:  Diet Orders (From admission, onward)     Start     Ordered   11/09/23 0001  Diet NPO time specified Except for: Sips with Meds  Diet effective midnight       Question:  Except for  Answer:  Bula Carney with Meds   11/08/23 2246           Family Communication:  None   Consults:  [facility]   Admission status:   Observation, Telemetry bed  Severity of Illness: The appropriate patient status for this patient is OBSERVATION. Observation status is judged to be reasonable and necessary in order to provide the required intensity of service to ensure the patient's safety. The patient's presenting symptoms, physical exam findings, and initial radiographic and laboratory data in the context of their medical condition is felt to place them at decreased risk for further clinical deterioration. Furthermore, it is anticipated that the patient will be medically stable for discharge from the hospital within 2 midnights of  admission.    Arnulfo Larch, MD [facility]  How to contact the [facility] Attending or Consulting provider 7A - 7P or covering provider during after hours 7P -7A, for this patient.  Check the care team in [facility] and look for a) attending/consulting [facility] provider listed and b) the [facility] team listed Log into [email or web address] and use [facility]'s universal password to access. If you do not have the password, please contact the hospital operator. Locate the [facility] provider you are looking for under [facility] and page to a number that you can be directly reached. If you still have difficulty reaching the provider, please page the [facility] (Director on Call) for the Hospitalists listed on amion for assistance.  11/09/2023, 12:15 AM

## 2023-11-09 NOTE — H&P (View-Only) (Signed)
 [facility] Consult  Referring Provider: [facility] Primary Care Physician:  [facility] [place] Primary Gastroenterologist: Dr. Kimble Pennant  Reason for Consultation: Coffee-ground emesis  HPI: Johnny Mills is a 58 y.o. male with history of hiatal hernia and Barrett's esophagus who [facility] with nausea, vomiting and coffee-ground emesis.  Patient states he has multiple intermittent episodes of" gastritis", which she describes as epigastric and lower sternal pressure which improves with nausea and vomiting however he has never had vomiting of coffee-ground material. He denies noticing blood in stool or black stools. He denies acid reflux or heartburn. Denies difficulty swallowing or pain on swallowing. He is unintentional weight loss or loss of appetite. He takes pantoprazole 40 mg once a day. He also takes aspirin 81 mg a day but denies use of other NSAIDs, antiplatelets, Goody powders.   History reviewed. No pertinent past medical history.  Past Surgical History:  Procedure Laterality Date   FRACTURE SURGERY      Prior to Admission medications   Medication Sig Start Date End Date Taking? Authorizing Provider  aspirin EC 81 MG tablet Take 81 mg by mouth daily. Swallow whole.   Yes [provider]  buPROPion (WELLBUTRIN XL) 150 MG 24 hr tablet Take 150 mg by mouth every morning. 09/28/23  Yes [provider]  folic acid (FOLVITE) 1 MG tablet Take 1 mg by mouth daily.   Yes [provider]  L-ARGININE PO Take 1 Dose by mouth daily.   Yes [provider]  meloxicam (MOBIC) 15 MG tablet Take 15 mg by mouth daily. 10/05/23  Yes [provider]  Multiple Vitamins-Minerals (ZINC PO) Take 1 tablet by mouth daily.   Yes [provider]  naproxen sodium (ALEVE) 220 MG tablet Take 220-440 mg by mouth 2 (two) times daily as needed (Pain).   Yes [provider]  pantoprazole  (PROTONIX) 40 MG tablet Take 40 mg by mouth daily.   Yes [provider]  Saw Palmetto, Serenoa repens, (SAW PALMETTO PO) Take 1 Dose by mouth daily.   Yes [provider]    Current Facility-Administered Medications  Medication Dose Route Frequency Provider Last Rate Last Admin   buPROPion (WELLBUTRIN XL) 24 hr tablet 150 mg  150 mg Oral Daily Segars, Jonathan, MD       pantoprazole (PROTONIX) injection 40 mg  40 mg Intravenous Q12H Segars, Jonathan, MD   40 mg at 11/09/23 0536   sodium chloride flush (NS) 0.9 % injection 3 mL  3 mL Intravenous Q12H Arnulfo Larch, MD   3 mL at 11/08/23 2303    Allergies as of 11/08/2023 - Review Complete 11/08/2023  Allergen Reaction Noted   Duloxetine Other (See Comments) 06/27/2021   Duloxetine hcl Other (See Comments) 06/27/2021   Escitalopram Other (See Comments) 06/27/2021   Fluoxetine Other (See Comments) 06/27/2021   Penicillamine Other (See Comments) 08/12/2021   Penicillins Hives and Other (See Comments) 06/30/2018   Sulfa antibiotics Hives 10/12/2019    History reviewed. No pertinent family history.  Social History   Socioeconomic History   Marital status: Married    Spouse name: Not on file   Number of children: Not on file   Years of education: Not on file   Highest education level: Not on file  Occupational History   Not on file  Tobacco Use   Smoking status: Former   Smokeless tobacco: Never  Vaping Use   Vaping status: Never Used  Substance  and Sexual Activity   Alcohol use: Not on file   Drug use: Not on file   Sexual activity: Not on file  Other Topics Concern   Not on file  Social History Narrative   Not on file   Social Drivers of Health   Financial Resource Strain: Not on file  Food Insecurity: No Food Insecurity (11/08/2023)   Hunger Vital Sign    Worried About Running Out of Food in the Last Year: Never true    Ran Out of Food in the Last Year: Never true  Transportation Needs: No  Transportation Needs (11/08/2023)   PRAPARE - Administrator, Civil Service (Medical): No    Lack of Transportation (Non-Medical): No  Physical Activity: Not on file  Stress: Not on file  Social Connections: Not on file  Intimate Partner Violence: Not At Risk (11/08/2023)   Humiliation, Afraid, Rape, and Kick questionnaire    Fear of Current or Ex-Partner: No    Emotionally Abused: No    Physically Abused: No    Sexually Abused: No    Review of Systems: As per HPI.  Physical Exam: Vital signs in last 24 hours: Temp:  [98 F (36.7 C)-98.4 F (36.9 C)] 98 F (36.7 C) (06/09 1328) Pulse Rate:  [52-102] 63 (06/09 1328) Resp:  [10-20] 18 (06/09 1328) BP: (109-149)/(69-103) 114/70 (06/09 1328) SpO2:  [90 %-96 %] 94 % (06/09 1328) Weight:  [92.5 kg] 92.5 kg (06/08 2242) Last BM Date :  (PTA)  General:   Alert,  Well-developed, well-nourished, pleasant and cooperative in NAD Head:  Normocephalic and atraumatic. Eyes:  Sclera clear, no icterus.   Conjunctiva pink. Ears:  Normal auditory acuity. Nose:  No deformity, discharge,  or lesions. Mouth:  No deformity or lesions.  Oropharynx pink & moist. Neck:  Supple; no masses or thyromegaly. Lungs:  Clear throughout to auscultation.   No wheezes, crackles, or rhonchi. No acute distress. Heart:  Regular rate and rhythm; no murmurs, clicks, rubs,  or gallops. Extremities:  Without clubbing or edema. Neurologic:  Alert and  oriented x4;  grossly normal neurologically. Skin:  Intact without significant lesions or rashes. Psych:  Alert and cooperative. Normal mood and affect. Abdomen:  Soft, nontender and nondistended. No masses, hepatosplenomegaly or hernias noted. Normal bowel sounds, without guarding, and without rebound.         Lab Results: Recent Labs    11/08/23 1742 11/09/23 0547  WBC 8.3 5.7  HGB 15.2 12.9*  HCT 44.7 41.1  PLT 325 237   BMET Recent Labs    11/08/23 1742 11/09/23 0547  NA 144 139  K 3.9 4.2   CL 102 105  CO2 28 26  GLUCOSE 139* 125*  BUN 19 26*  CREATININE 1.06 0.82  CALCIUM 10.8* 9.0   LFT Recent Labs    11/08/23 1742  PROT 7.8  ALBUMIN 4.5  AST 22  ALT 26  ALKPHOS 98  BILITOT 0.6   PT/INR Recent Labs    11/09/23 0547  LABPROT 14.7  INR 1.1    Studies/Results: DG Chest 2 View Result Date: 11/08/2023 CLINICAL DATA:  Hematemesis EXAM: CHEST - 2 VIEW COMPARISON:  None Available. FINDINGS: Large hiatal hernia with internal fluid levels. The lungs are clear. Normal heart silhouette. No pleural effusion or pneumothorax. IMPRESSION: Large hiatal hernia with internal fluid levels. Electronically Signed   By: Juanetta Nordmann M.D.   On: 11/08/2023 20:44    Impression: Coffee-ground emesis Elevated BUN/creatinine ratio  of 26/0.82 Hemoglobin 12.9, was 15.2 on admission History of large hiatal hernia  Plan: Diagnostic EGD in a.m.Aaron Aas Clear liquid diet today, n.p.o. postmidnight. Continue pantoprazole 40 mg every 12 hours for now. He is no longer nauseous and has not needed Zofran since yesterday evening.   LOS: 0 days   Genell Ken, MD  11/09/2023, 2:35 PM

## 2023-11-10 ENCOUNTER — Observation Stay (HOSPITAL_COMMUNITY): Payer: Self-pay | Admitting: Anesthesiology

## 2023-11-10 ENCOUNTER — Encounter (HOSPITAL_COMMUNITY): Payer: Self-pay | Admitting: Internal Medicine

## 2023-11-10 ENCOUNTER — Encounter (HOSPITAL_COMMUNITY)
Admission: EM | Disposition: A | Discharge status: Home / Self Care | Payer: Self-pay | Source: Home / Self Care | Attending: Emergency Medicine

## 2023-11-10 ENCOUNTER — Observation Stay (HOSPITAL_BASED_OUTPATIENT_CLINIC_OR_DEPARTMENT_OTHER): Payer: Self-pay | Admitting: Anesthesiology

## 2023-11-10 DIAGNOSIS — K449 Diaphragmatic hernia without obstruction or gangrene: Secondary | ICD-10-CM | POA: Diagnosis not present

## 2023-11-10 DIAGNOSIS — K317 Polyp of stomach and duodenum: Secondary | ICD-10-CM

## 2023-11-10 DIAGNOSIS — K920 Hematemesis: Secondary | ICD-10-CM | POA: Diagnosis not present

## 2023-11-10 DIAGNOSIS — K922 Gastrointestinal hemorrhage, unspecified: Secondary | ICD-10-CM | POA: Diagnosis not present

## 2023-11-10 SURGERY — EGD (ESOPHAGOGASTRODUODENOSCOPY)
Anesthesia: Monitor Anesthesia Care

## 2023-11-10 MED ORDER — LIDOCAINE 2% (20 MG/ML) 5 ML SYRINGE
INTRAMUSCULAR | Status: DC | PRN
  Administered 2023-11-10: 80 mg via INTRAVENOUS

## 2023-11-10 MED ORDER — PROPOFOL 10 MG/ML IV BOLUS
INTRAVENOUS | Status: DC | PRN
  Administered 2023-11-10 (×3): 50 mg via INTRAVENOUS

## 2023-11-10 MED ORDER — SODIUM CHLORIDE 0.9 % IV SOLN: INTRAVENOUS | Status: DC

## 2023-11-10 MED ORDER — PROPOFOL 500 MG/50ML IV EMUL
INTRAVENOUS | Status: DC | PRN
  Administered 2023-11-10: 100 ug/kg/min via INTRAVENOUS

## 2023-11-10 MED ORDER — MELOXICAM 15 MG PO TABS: 15.0000 mg | ORAL_TABLET | Freq: Every day | ORAL | Status: DC | PRN

## 2023-11-10 MED ORDER — SODIUM CHLORIDE 0.9 % IV SOLN: INTRAVENOUS | Status: DC | PRN

## 2023-11-10 NOTE — Transfer of Care (Signed)
 Immediate Anesthesia Transfer of Care Note  Patient: Thaddeaus Monica  Procedure(s) Performed: EGD (ESOPHAGOGASTRODUODENOSCOPY)  Patient Location: PACU and Endoscopy Unit  Anesthesia Type:MAC  Level of Consciousness: awake and alert   Airway & Oxygen Therapy: Patient Spontanous Breathing and Patient connected to nasal cannula oxygen  Post-op Assessment: Report given to RN and Post -op Vital signs reviewed and stable  Post vital signs: Reviewed and stable  Last Vitals:  Vitals Value Taken Time  BP    Temp    Pulse    Resp    SpO2      Last Pain:  Vitals:   11/10/23 1313  TempSrc: Temporal  PainSc: 0-No pain         Complications: No notable events documented.

## 2023-11-10 NOTE — Discharge Instructions (Signed)
 Please avoid daily use of aspirin, meloxicam, naproxen and only use occasionally. Please return to general surgeon to discuss surgery for your large hiatal hernia.  You were cared for by a hospitalist during your hospital stay. Please review all of you discharge paperwork on the day of discharge and be sure you have all of your prescribed medications and please read the below instructions:  Once you are discharged, your primary care physician will handle any further medical issues. Please note that NO REFILLS for any discharge medications will be authorized once you are discharged as it is imperative that you return to your primary care physician (or establish a relationship with a primary care physician if you do not have one) for your aftercare needs. Please obtain a follow up appointment with your primary care physician within 1-2 weeks of discharge. Please take all your medications with you for your next visit with your Primary MD. Please request your Primary MD to go over all Hospital Tests and Procedures, Radiological results at the follow up appointment. In some cases, there will be blood work, cultures and biopsy results pending at the time of your discharge. Please request that your primary care M.D. goes through all the records of your hospital data and follows up on these results. Please get all hospital records sent to your primary MD by signing hospital release before you go home or request your primary care doctor's office to assist with obtaining medical records.   You must read complete instructions/literature along with all the possible adverse reactions/side effects for all the medicines that have been prescribed to you. Take any new medicines after you have completely understood and accpet all the possible adverse reactions/side effects.  Please take medications as prescribed and speak with your doctor if changes are needed.   If you have smoked or chewed tobacco in the last 2 yrs  please stop. Stop any regular alcohol  and or any recreational drug use. Wear Seat belts while driving.   If you had Pneumonia at the Hospital: Please get a 2 view Chest X ray done in 6-8 weeks after hospital discharge or sooner if instructed by your Primary MD.   If you have Congestive Heart Failure: Follow a cardiac low salt diet and 1.5 lit/day fluid restriction. Please call your Cardiologist or Primary MD anytime you have any of the following symptoms:  1) 3 pound weight gain in 24 hours or 5 pounds in 1 week  2) shortness of breath, with or without a dry hacking cough  3) increasing swelling in the feet or stomach  4) if you have to sleep on extra pillows at night in order to breathe   If you have Diabetes: Check blood sugars 4 times/day- once on AM empty stomach and then before each meal. Log in all results and show them to your primary doctor at your next visit. If glucose readings are often under 60 or above 400 call your primary MD to see if medication dosages need to be adjusted   If you have Syncope (passing out) or Seizure/Convulsions/Epilepsy: Please do not drive, operate heavy machinery, participate in activities at heights or participate in high speed sports until you have seen by Primary MD or a Neurologist and advised to do so again. Per [place] statutes, patients with seizures are not allowed to drive until they have been seizure-free for six months.  Use caution when using heavy equipment or power tools. Avoid working on ladders or at heights. Take  showers instead of baths. Ensure the water temperature is not too high on the home water heater. Do not go swimming alone. Do not lock yourself in a room alone (i.e. bathroom). When caring for infants or small children, sit down when holding, feeding, or changing them to minimize risk of injury to the child in the event you have a seizure. Maintain good sleep hygiene. Avoid alcohol.    If you had Gastrointestinal  Bleeding: Please ask your Primary MD to check a complete blood count within one week of discharge or at your next visit. Your endoscopic/colonoscopic biopsies that are pending at the time of discharge will also need to followed by your Primary MD.    Johnny Mills can reach the hospitalist office at phone [phone] or fax [phone]   If you do not have a primary care physician, you can call [phone] for a physician referral.

## 2023-11-10 NOTE — Op Note (Signed)
 [facility] Patient Name: Johnny Mills Procedure Date: 11/10/2023 MRN: [id number] Attending MD: Genell Ken , MD, [id number] Date of Birth: 1965/09/28 CSN: [id number] Age: 58 Admit Type: Inpatient Procedure:                Upper GI endoscopy Indications:              Coffee-ground emesis Providers:                Genell Ken, MD, Golda Latch, RN, Tyrus Gallus, Technician Referring MD:             [facility] Medicines:                Monitored Anesthesia Care Complications:            No immediate complications. Estimated Blood Loss:     Estimated blood loss: none. Procedure:                Pre-Anesthesia Assessment:                           - Prior to the procedure, a History and Physical                            was performed, and patient medications and                            allergies were reviewed. The patient's tolerance of                            previous anesthesia was also reviewed. The risks                            and benefits of the procedure and the sedation                            options and risks were discussed with the patient.                            All questions were answered, and informed consent                            was obtained. Prior Anticoagulants: The patient has                            taken no anticoagulant or antiplatelet agents. ASA                            Grade Assessment: II - A patient with mild systemic                            disease. After reviewing the risks and benefits,  the patient was deemed in satisfactory condition to                            undergo the procedure.                           After obtaining informed consent, the endoscope was                            passed under direct vision. Throughout the                            procedure, the patient's blood pressure, pulse, and                            oxygen  saturations were monitored continuously. The                            [id number]-H190 ([id number]) Olympus endoscope was introduced                            through the mouth, and advanced to the second part                            of duodenum. The upper GI endoscopy was                            accomplished without difficulty. The patient                            tolerated the procedure well. Scope In: Scope Out: Findings:      The examined esophagus was normal.      The Z-line was found 40 cm from the incisors.      An extremely large hiatal hernia was present. It is possible that the       entire stomach is intrathoracic.      It was extremely difficult to advance the scope towards the pylorus and       into the duodenum due to significant looping of the scope within the       hiatal hernia.      The gastric antrum, prepyloric region of the stomach and pylorus were       normal.      A single large pedunculated polyp (thick base) was found in the second       to third portion of the duodenum.      It had a smooth surface, appeared to be lipomatous vs mucosal prolapse       related polyp. Impression:               - Normal esophagus.                           - Z-line, 40 cm from the incisors.                           - Large hiatal hernia.                           -  Normal antrum, prepyloric region of the stomach                            and pylorus.                           - A single large duodenal polyp.                           - No specimens collected. Moderate Sedation:      Patient did not receive moderate sedation for this procedure, but       instead received monitored anesthesia care. Recommendation:           - GERD prevention diet.                           - Consider surgery for repair of extremely large                            hiatal hernia. Procedure Code(s):        --- Professional ---                           [phone], Esophagogastroduodenoscopy,  flexible,                            transoral; diagnostic, including collection of                            specimen(s) by brushing or washing, when performed                            (separate procedure) Diagnosis Code(s):        --- Professional ---                           K44.9, Diaphragmatic hernia without obstruction or                            gangrene                           K31.7, Polyp of stomach and duodenum                           K92.0, Hematemesis CPT copyright 2022 American Medical Association. All rights reserved. The codes documented in this report are preliminary and upon coder review may  be revised to meet current compliance requirements. Genell Ken, MD 11/10/2023 2:46:59 PM This report has been signed electronically. Number of Addenda: 0

## 2023-11-10 NOTE — Anesthesia Postprocedure Evaluation (Signed)
 Anesthesia Post Note  Patient: Johnny Mills  Procedure(s) Performed: EGD (ESOPHAGOGASTRODUODENOSCOPY)     Patient location during evaluation: Endoscopy Anesthesia Type: MAC Level of consciousness: awake and alert Pain management: pain level controlled Vital Signs Assessment: post-procedure vital signs reviewed and stable Respiratory status: spontaneous breathing, nonlabored ventilation, respiratory function stable and patient connected to nasal cannula oxygen Cardiovascular status: blood pressure returned to baseline and stable Postop Assessment: no apparent nausea or vomiting Anesthetic complications: no  No notable events documented.  Last Vitals:  Vitals:   11/10/23 1510 11/10/23 1532  BP: 101/63 122/83  Pulse: (!) 53 (!) 56  Resp: 13 16  Temp:  (!) 36.4 C  SpO2: 98% 95%    Last Pain:  Vitals:   11/10/23 1532  TempSrc: Oral  PainSc:                  [name]

## 2023-11-10 NOTE — Progress Notes (Signed)
 AVS reviewed w/ pt and spouse who verbalized an understanding. PIV removed as noted. Tele removed - central tele notified by primary nurse. Pt dressed for d/c to home. Pt to call Dr Hershell Lose ASAP regarding need for surgery for hiatal hernia.  Pt saw Dr Hershell Lose two years ago. Pt to also follow up with his PCP at  [facility] to see if a referral is needed. No other questions at this time. PT ambulatory to main entrance - home with wife.

## 2023-11-10 NOTE — Progress Notes (Signed)
   11/10/23 0936  TOC Brief Assessment  Insurance and Status Reviewed  Patient has primary care physician Yes  Home environment has been reviewed single family home  Prior level of function: independent  Prior/Current Home Services No current home services  Social Drivers of Health Review SDOH reviewed no interventions necessary  Readmission risk has been reviewed Yes  Transition of care needs no transition of care needs at this time    [place], MSW, LCSW 11/10/2023 9:49 AM

## 2023-11-10 NOTE — Interval H&P Note (Signed)
 History and Physical Interval Note: 58 year old male with history of hiatal hernia and Barrett's esophagus with coffee-ground emesis for EGD with propofol.  11/10/2023 1:51 PM  Johnny Mills  has presented today for EGD with propofol, with the diagnosis of coffee ground emesis.  The various methods of treatment have been discussed with the patient and family. After consideration of risks, benefits and other options for treatment, the patient has consented to  Procedure(s): EGD (ESOPHAGOGASTRODUODENOSCOPY) (N/A) as a surgical intervention.  The patient's history has been reviewed, patient examined, no change in status, stable for surgery.  I have reviewed the patient's chart and labs.  Questions were answered to the patient's satisfaction.     Genell Ken

## 2023-11-10 NOTE — Discharge Summary (Signed)
 Physician Discharge Summary  Johnny Mills NWG:[id number] DOB: 1966/05/24 DOA: 11/08/2023  PCP: Mordechai April, DO  Admit date: 11/08/2023 Discharge date: 11/10/2023 Discharging to: Home Recommendations for Outpatient Follow-up:  Needs to follow-up with general surgery to repair large hiatal hernia  Consults:  GI Procedures:  EGD   Discharge Diagnoses:   Principal Problem:   Upper GI bleed Active Problems:   Hiatal hernia   Barretts esophagus   Gastroesophageal reflux disease   Dehydration        Brief hospital course: 58 y/o male with hiatal hernia, Barrett's esophagus, obesity who has bouts of vomiting every few months. He states rich food and eating too much trigger them. This time he ate at a barbecue and developed vomiting. He eventually developed hematemesis and came to the ED.     Assessment and Plan: Principal Problem:   Upper GI bleed - h/o Barretts, Hiatal hernia and intermittent Meloxicam use - started as vomiting w/o blood but then had 4 episodes of bloody vomitus - GI consulted - EGD performed today he has an "extremely large hiatal hernia"-she has been holding off on getting surgery however, GI recommends that he follow-up with general surgery and continue the Protonix that he takes daily - He is tolerated solid food after EGD and is ready to be discharged- -Avoid excess NSAIDs  Active Problems:   Dehydration  - given 1 L NS and then D5NS at 75 cc/hr              Discharge Instructions   Allergies as of 11/10/2023       Reactions   Duloxetine Other (See Comments)   Duloxetine Hcl Other (See Comments)   fatigue   Escitalopram Other (See Comments)   Fluoxetine Other (See Comments)   fatigue   Penicillamine Other (See Comments)   Penicillins Hives, Other (See Comments)   Sulfa Antibiotics Hives        Medication List     STOP taking these medications    aspirin EC 81 MG tablet   naproxen sodium 220 MG tablet Commonly known as: ALEVE        TAKE these medications    buPROPion 150 MG 24 hr tablet Commonly known as: WELLBUTRIN XL Take 150 mg by mouth every morning.   folic acid 1 MG tablet Commonly known as: FOLVITE Take 1 mg by mouth daily.   L-ARGININE PO Take 1 Dose by mouth daily.   meloxicam 15 MG tablet Commonly known as: MOBIC Take 1 tablet (15 mg total) by mouth daily as needed for pain. What changed:  when to take this reasons to take this   pantoprazole 40 MG tablet Commonly known as: PROTONIX Take 40 mg by mouth daily.   SAW PALMETTO PO Take 1 Dose by mouth daily.   ZINC PO Take 1 tablet by mouth daily.            The results of significant diagnostics from this hospitalization (including imaging, microbiology, ancillary and laboratory) are listed below for reference.    DG Chest 2 View Result Date: 11/08/2023 CLINICAL DATA:  Hematemesis EXAM: CHEST - 2 VIEW COMPARISON:  None Available. FINDINGS: Large hiatal hernia with internal fluid levels. The lungs are clear. Normal heart silhouette. No pleural effusion or pneumothorax. IMPRESSION: Large hiatal hernia with internal fluid levels. Electronically Signed   By: Juanetta Nordmann M.D.   On: 11/08/2023 20:44   Labs:   Basic Metabolic Panel: Recent Labs  Lab 11/08/23 1742 11/09/23 0547  NA 144 139  K 3.9 4.2  CL 102 105  CO2 28 26  GLUCOSE 139* 125*  BUN 19 26*  CREATININE 1.06 0.82  CALCIUM 10.8* 9.0  MG  --  2.0  PHOS  --  4.1     CBC: Recent Labs  Lab 11/08/23 1742 11/09/23 0547  WBC 8.3 5.7  HGB 15.2 12.9*  HCT 44.7 41.1  MCV 90.9 96.5  PLT 325 237         SIGNED:   Sedalia Dacosta, MD  [facility] 11/10/2023, 5:51 PM Time taking on discharge: 50 minutes

## 2023-11-10 NOTE — Plan of Care (Signed)

## 2023-11-13 ENCOUNTER — Encounter (HOSPITAL_COMMUNITY): Payer: Self-pay | Admitting: Gastroenterology

## 2023-12-07 ENCOUNTER — Ambulatory Visit: Payer: Self-pay | Admitting: Surgery

## 2024-01-09 IMAGING — RF DG UGI W/ HIGH DENSITY W/O KUB
9 series · 14 of 24 positions shown · non-contrast
Comparison: 11/01/2020 abdominal sonogram.

CLINICAL DATA: Postprandial abdominal pain. Gastroesophageal reflux
disease. Large hiatal hernia.

EXAM:
UPPER GI SERIES WITH KUB
TECHNIQUE: After obtaining a scout radiograph a routine upper GI series was
performed using thin and high density barium.
FLUOROSCOPY:
Radiation Exposure Index (if provided by the fluoroscopic device):
69.6 mGy

[Series 1: one shot · 0.14mm/px · 1 of 1 slices shown (1 of 4)]
[im 1/1]
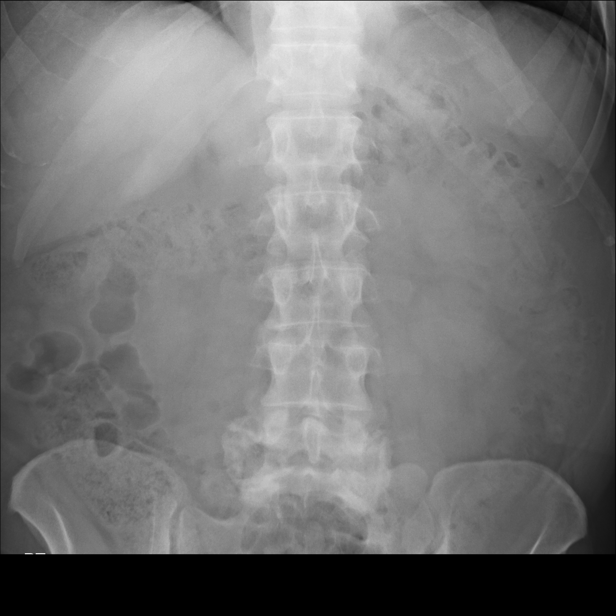

[Series 2: sequence · 1 of 11 frames shown (1 of 5)]
[frame 9/11]
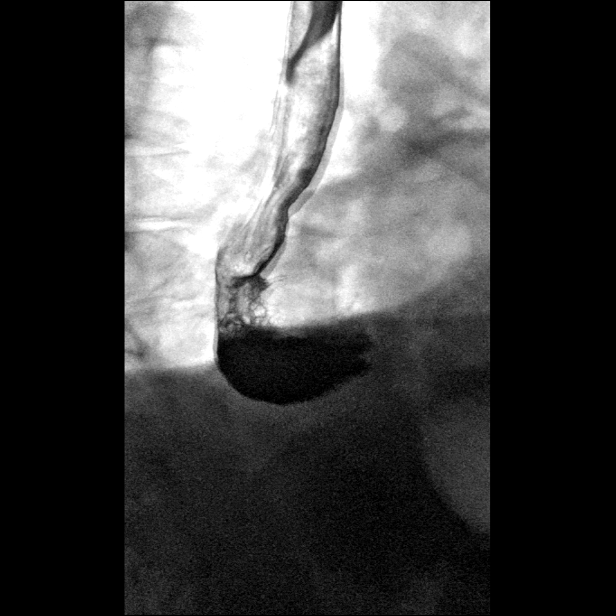

[Series 3: one shot · 0.16mm/px · 4 of 11 slices shown (2 of 4)]
[im 2/11]
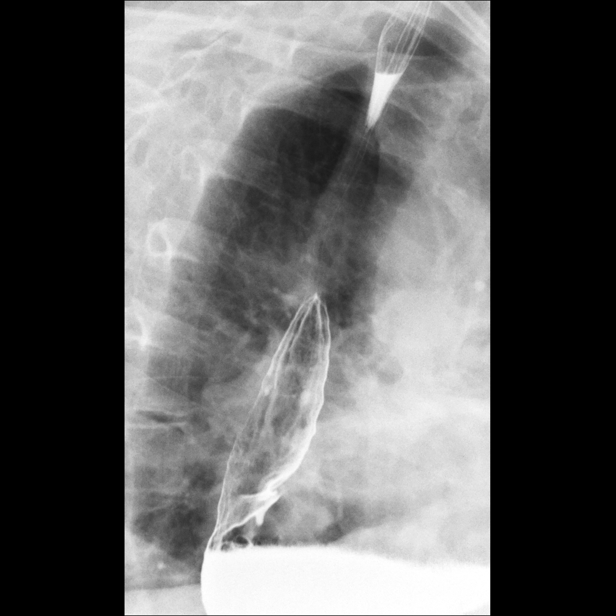
[im 5/11]
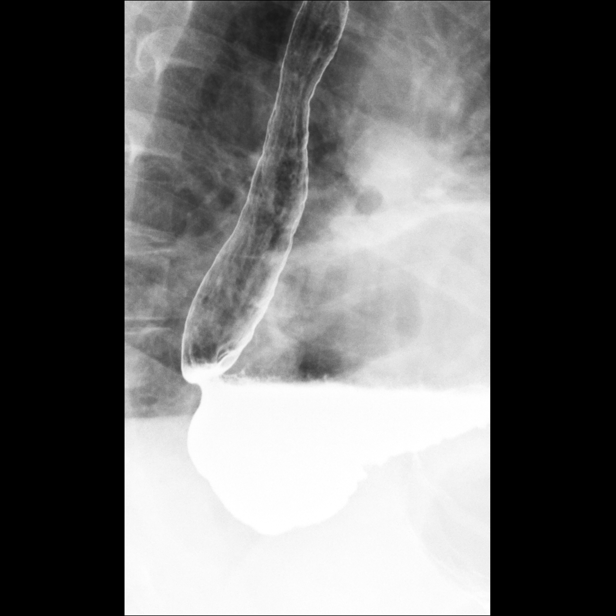
[im 7/11]
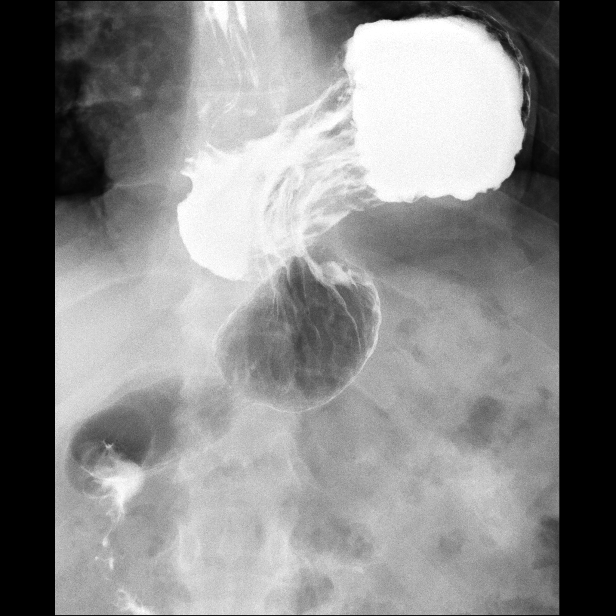
[im 10/11]
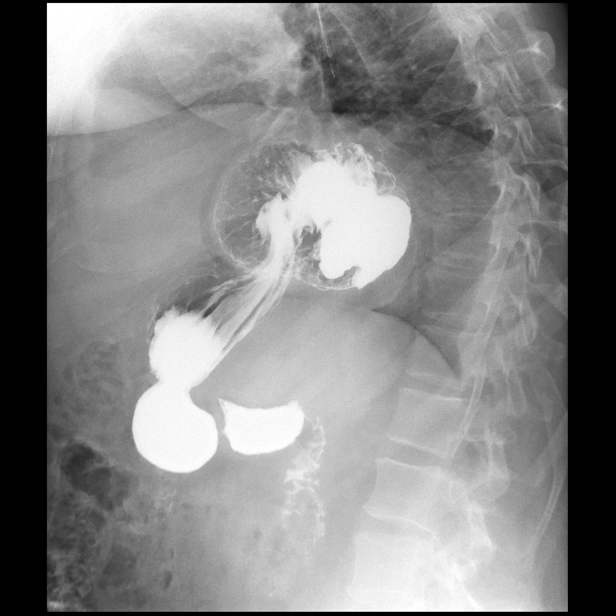

[Series 4: sequence · 2 of 30 frames shown (2 of 5)]
[frame 13/30]
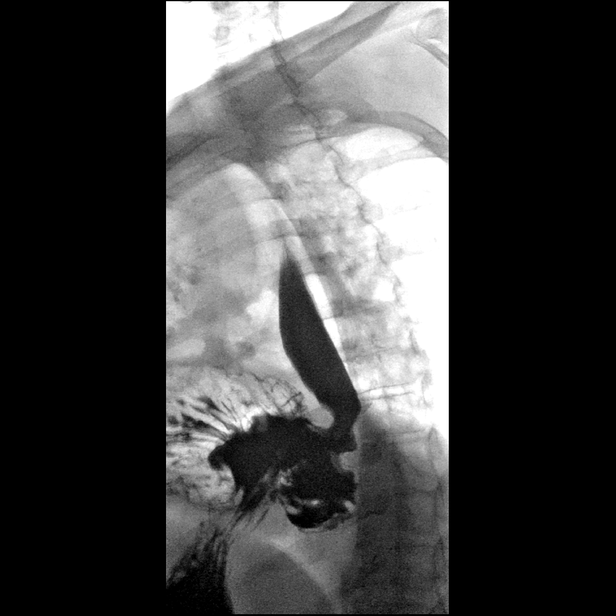
[frame 26/30]
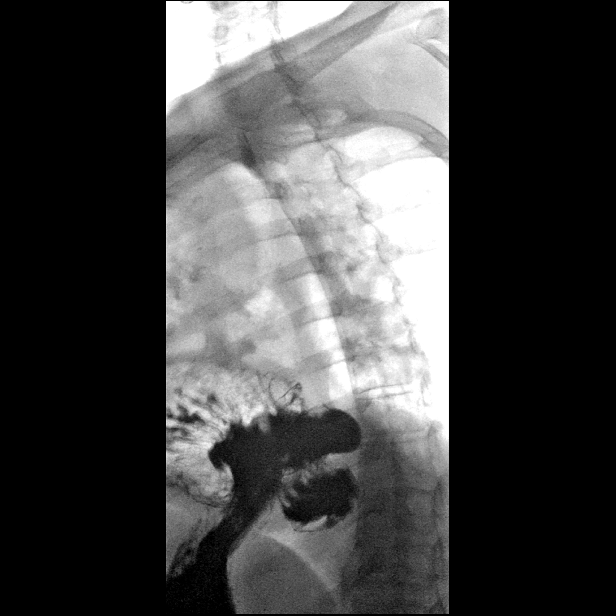

[Series 5: sequence · 1 of 97 frames shown (3 of 5)]
[frame 82/97]
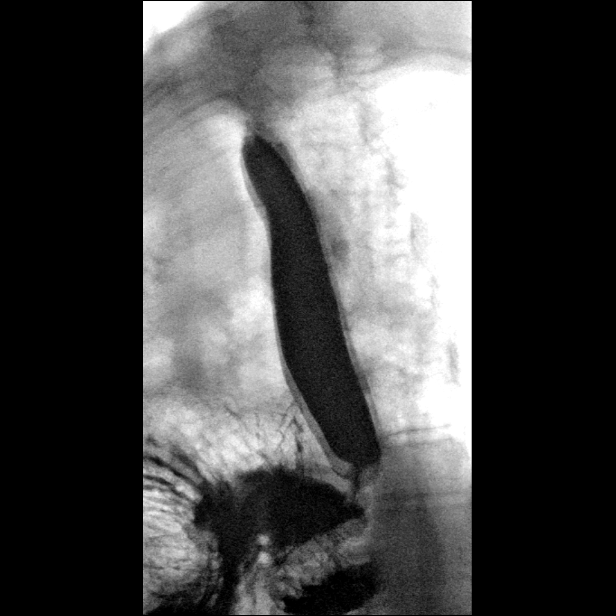

[Series 6: one shot · 0.16mm/px · 1 of 4 slices shown (3 of 4)]
[im 2/4]
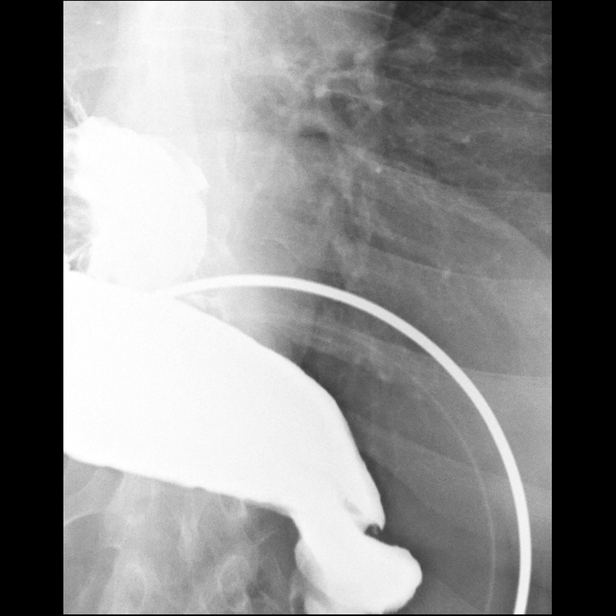

[Series 7: sequence · 2 of 33 frames shown (4 of 5)]
[frame 5/33]
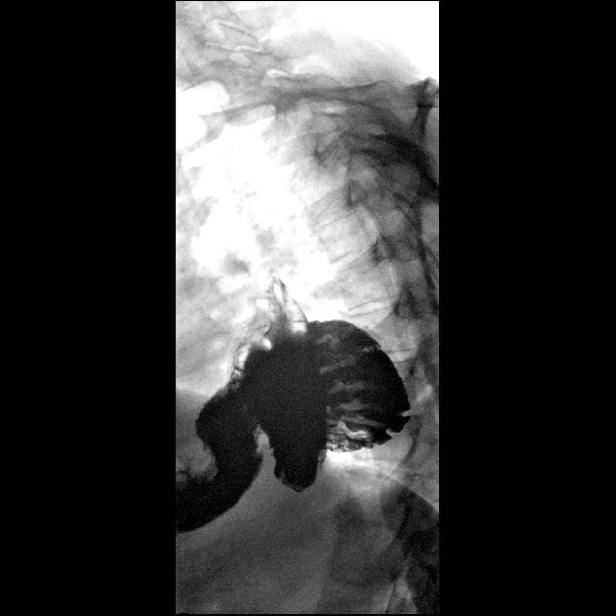
[frame 29/33]
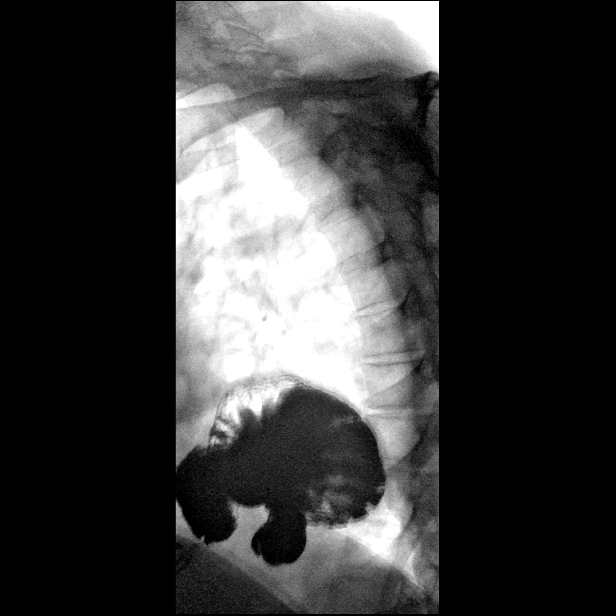

[Series 8: sequence · 1 of 27 frames shown (5 of 5)]
[frame 7/27]
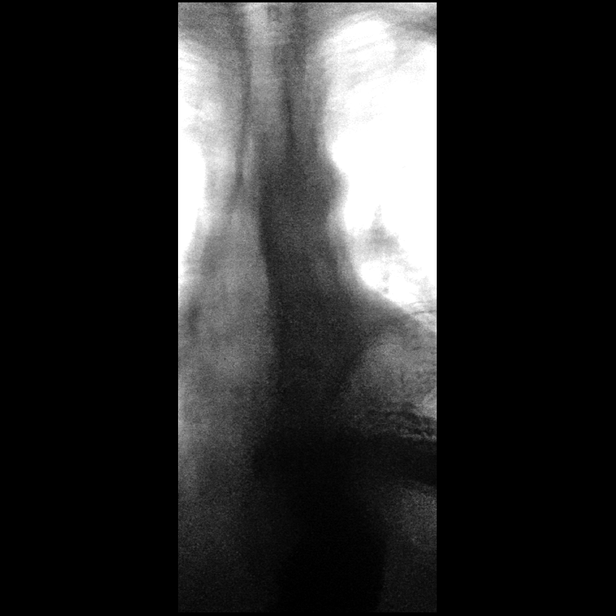

[Series 9: one shot · 1 of 1 slices shown (4 of 4)]
[im 1/1]
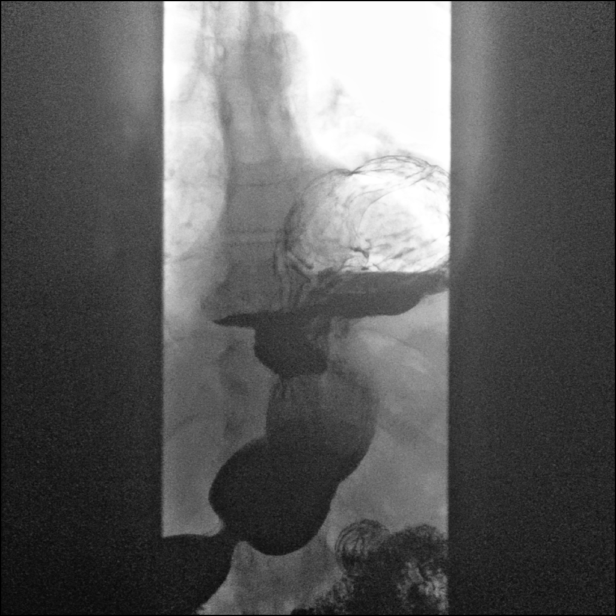

[14 of 24 positions shown; findings below may reference images not displayed]

FINDINGS: Scout radiograph demonstrates no dilated small bowel loops. Mild
colonic stool. No evidence of pneumatosis or pneumoperitoneum. No
radiopaque nephrolithiasis.

Large hiatal hernia (proximal [DATE] of stomach is located in the
chest). No evidence of gastric volvulus. Moderate gastroesophageal
reflux elicited to the level of the mid to upper thoracic esophagus
with water siphon test. Normal esophageal motility. Mild granularity
of the lower thoracic esophageal mucosa, suggesting mild reflux
esophagitis. Normal esophageal distensibility, with no evidence of
esophageal mass, stricture or ulcer. Normal gastric emptying. No
gastric fold thickening, filling defects, ulcers or significant
erosions. No duodenal fold thickening, filling defects, ulcers or
strictures. Small distal transverse duodenal diverticulum. Duodenal
jejunal junction is normal in location to the left of the spine.
Visualized proximal jejunal small bowel loops are normal caliber and
without fold thickening.
IMPRESSION: 1. Large hiatal hernia.  Moderate gastroesophageal reflux elicited.
2. Mild reflux esophagitis. No evidence of esophageal mass or
stricture.
3. Otherwise unremarkable upper GI.

## 2024-02-03 ENCOUNTER — Ambulatory Visit (HOSPITAL_COMMUNITY): Admit: 2024-02-03 | Admitting: Surgery

## 2024-02-03 ENCOUNTER — Encounter (HOSPITAL_COMMUNITY): Payer: Self-pay

## 2024-02-03 SURGERY — MANOMETRY, ANORECTAL

## 2024-02-18 NOTE — Progress Notes (Signed)
 COVID Vaccine received:  []  No [x]  Yes Date of any COVID positive Test in last 90 days: no PCP - Bernardino Boone DO Cardiologist - n/a  Chest x-ray - 11/08/23 Epic EKG -  11/08/23 Epic Stress Test -  ECHO -  Cardiac Cath -   Medical clearance-12/03/23- Dr. Bernardino Boone  Bowel Prep - [x]  No  []   Yes ______  Pacemaker / ICD device [x]  No []  Yes   Spinal Cord Stimulator:[x]  No []  Yes       History of Sleep Apnea? [x]  No []  Yes   CPAP used?- [x]  No []  Yes    Does the patient monitor blood sugar?          [x]  No []  Yes  []  N/A  Patient has: [x]  NO Hx DM   []  Pre-DM                 []  DM1  []   DM2 Does patient have a [employer] or Dexacom? []  No []  Yes   Fasting Blood Sugar Ranges-  Checks Blood Sugar _____ times a day  GLP1 agonist / usual dose - no GLP1 instructions:  SGLT-2 inhibitors / usual dose - no SGLT-2 instructions:   Blood Thinner / Instructions:no Aspirin Instructions:no  Comments:   Activity level: Patient is able /to climb a flight of stairs without difficulty; [x]  No CP  [x]  No SOB,   Patient can perform ADLs without assistance.   Anesthesia review:   Patient denies shortness of breath, fever, cough and chest pain at PAT appointment.  Patient verbalized understanding and agreement to the Pre-Surgical Instructions that were given to them at this PAT appointment. Patient was also educated of the need to review these PAT instructions again prior to his/her surgery.I reviewed the appropriate phone numbers to call if they have any and questions or concerns.

## 2024-02-18 NOTE — Patient Instructions (Signed)
 SURGICAL WAITING ROOM VISITATION  Patients having surgery or a procedure may have no more than 2 support people in the waiting area - these visitors may rotate.    Children under the age of 34 must have an adult with them who is not the patient.  Visitors with respiratory illnesses are discouraged from visiting and should remain at home.  If the patient needs to stay at the hospital during part of their recovery, the visitor guidelines for inpatient rooms apply. Pre-op nurse will coordinate an appropriate time for 1 support person to accompany patient in pre-op.  This support person may not rotate.    Please refer to the [facility] website for the visitor guidelines for Inpatients (after your surgery is over and you are in a regular room).       Your procedure is scheduled on: 02/24/24   Report to [facility] Main Entrance    Report to admitting at 6:15 AM   Call this number if you have problems the morning of surgery [phone]   Do not eat food :After Midnight.   After Midnight you may have the following liquids until 5:30 AM  DAY OF SURGERY  Water Non-Citrus Juices (without pulp, NO RED-Apple, White grape, White cranberry) Black Coffee (NO MILK/CREAM OR CREAMERS, sugar ok)  Clear Tea (NO MILK/CREAM OR CREAMERS, sugar ok) regular and decaf                             Plain Jell-O (NO RED)                                           Fruit ices (not with fruit pulp, NO RED)                                     Popsicles (NO RED)                                                               Sports drinks like Gatorade (NO RED)              Drink 2 Ensure/G2 drinks AT 10:00 PM the night before surgery.        The day of surgery:  Drink ONE (1) Pre-Surgery Clear Ensure at 5:30 AM the morning of surgery. Drink in one sitting. Do not sip.  This drink was given to you during your hospital  pre-op appointment visit. Nothing else to drink after completing the  Pre-Surgery  Clear Ensure.          I  Oral Hygiene is also important to reduce your risk of infection.                                    Remember - BRUSH YOUR TEETH THE MORNING OF SURGERY WITH YOUR REGULAR TOOTHPASTE  DENTURES WILL BE REMOVED PRIOR TO SURGERY PLEASE DO NOT APPLY Poly grip OR ADHESIVES!!!   Do NOT smoke after Midnight  Stop all vitamins and herbal supplements 7 days before surgery.   Take these medicines the morning of surgery with A SIP OF WATER: Wellbutrin , Pantoprazole              You may not have any metal on your body including hair pins, jewelry, and body piercing             Do not wear make-up, lotions, powders, perfumes/cologne, or deodorant              Men may shave face and neck.   Do not bring valuables to the hospital. [facility] IS NOT             RESPONSIBLE   FOR VALUABLES.   Contacts, glasses, dentures or bridgework may not be worn into surgery.   Bring small overnight bag day of surgery.   DO NOT BRING YOUR HOME MEDICATIONS TO THE HOSPITAL. PHARMACY WILL DISPENSE MEDICATIONS LISTED ON YOUR MEDICATION LIST TO YOU DURING YOUR ADMISSION IN THE HOSPITAL!    Patients discharged on the day of surgery will not be allowed to drive home.  Someone NEEDS to stay with you for the first 24 hours after anesthesia.   Special Instructions: Bring a copy of your healthcare power of attorney and living will documents the day of surgery if you haven't scanned them before.              Please read over the following fact sheets you were given: IF YOU HAVE QUESTIONS ABOUT YOUR PRE-OP INSTRUCTIONS PLEASE CALL [phone] [place]   If you received a COVID test during your pre-op visit  it is requested that you wear a mask when out in public, stay away from anyone that may not be feeling well and notify your surgeon if you develop symptoms. If you test positive for Covid or have been in contact with anyone that has tested positive in the last 10 days please notify you surgeon.     [facility] - Preparing for Surgery Before surgery, you can play an important role.  Because skin is not sterile, your skin needs to be as free of germs as possible.  You can reduce the number of germs on your skin by washing with CHG (chlorahexidine gluconate) soap before surgery.  CHG is an antiseptic cleaner which kills germs and bonds with the skin to continue killing germs even after washing. Please DO NOT use if you have an allergy to CHG or antibacterial soaps.  If your skin becomes reddened/irritated stop using the CHG and inform your nurse when you arrive at Short Stay. Do not shave (including legs and underarms) for at least 48 hours prior to the first CHG shower.  You may shave your face/neck.  Please follow these instructions carefully:  1.  Shower with CHG Soap the night before surgery and the  morning of surgery.  2.  If you choose to wash your hair, wash your hair first as usual with your normal  shampoo.  3.  After you shampoo, rinse your hair and body thoroughly to remove the shampoo.                             4.  Use CHG as you would any other liquid soap.  You can apply chg directly to the skin and wash.  Gently with a scrungie or clean washcloth.  5.  Apply the CHG Soap to your body ONLY FROM  THE NECK DOWN.   Do   not use on face/ open                           Wound or open sores. Avoid contact with eyes, ears mouth and   genitals (private parts).                       Wash face,  Genitals (private parts) with your normal soap.             6.  Wash thoroughly, paying special attention to the area where your    surgery  will be performed.  7.  Thoroughly rinse your body with warm water from the neck down.  8.  DO NOT shower/wash with your normal soap after using and rinsing off the CHG Soap.                9.  Pat yourself dry with a clean towel.            10.  Wear clean pajamas.            11.  Place clean sheets on your bed the night of your first shower and do not  sleep  with pets. Day of Surgery : Do not apply any lotions/deodorants the morning of surgery.  Please wear clean clothes to the hospital/surgery center.  FAILURE TO FOLLOW THESE INSTRUCTIONS MAY RESULT IN THE CANCELLATION OF YOUR SURGERY  PATIENT SIGNATURE_________________________________  NURSE SIGNATURE__________________________________  ________________________________________________________________________

## 2024-02-19 ENCOUNTER — Encounter (HOSPITAL_COMMUNITY)
Admission: RE | Admit: 2024-02-19 | Discharge: 2024-02-19 | Disposition: A | Discharge status: Home / Self Care | Source: Ambulatory Visit | Attending: Surgery | Admitting: Surgery

## 2024-02-19 ENCOUNTER — Encounter (HOSPITAL_COMMUNITY): Payer: Self-pay

## 2024-02-19 ENCOUNTER — Other Ambulatory Visit: Payer: Self-pay

## 2024-02-19 VITALS — BP 134/97 | HR 66 | Temp 98.1°F | Resp 16 | Ht 71.0 in | Wt 206.0 lb

## 2024-02-19 DIAGNOSIS — Z01818 Encounter for other preprocedural examination: Secondary | ICD-10-CM | POA: Insufficient documentation

## 2024-02-19 HISTORY — DX: Depression, unspecified: F32.A

## 2024-02-19 HISTORY — DX: Personal history of other diseases of the digestive system: Z87.19

## 2024-02-19 HISTORY — DX: Other psychoactive substance abuse, uncomplicated: F19.10

## 2024-02-19 HISTORY — DX: Gastro-esophageal reflux disease without esophagitis: K21.9

## 2024-02-19 HISTORY — DX: Anxiety disorder, unspecified: F41.9

## 2024-02-19 LAB — BASIC METABOLIC PANEL WITH GFR
Anion gap: 10 (ref 5–15)
BUN: 12 mg/dL (ref 6–20)
CO2: 25 mmol/L (ref 22–32)
Calcium: 9.6 mg/dL (ref 8.9–10.3)
Chloride: 104 mmol/L (ref 98–111)
Creatinine, Ser: 0.91 mg/dL (ref 0.61–1.24)
GFR, Estimated: >60 mL/min (ref >60)
Glucose, Bld: 111 mg/dL — ABNORMAL HIGH (ref 70–99)
Potassium: 4.7 mmol/L (ref 3.5–5.1)
Sodium: 139 mmol/L (ref 135–145)

## 2024-02-19 LAB — CBC
HCT: 44.5 % (ref 39.0–52.0)
Hemoglobin: 14.4 g/dL (ref 13.0–17.0)
MCH: 30.3 pg (ref 26.0–34.0)
MCHC: 32.4 g/dL (ref 30.0–36.0)
MCV: 93.7 fL (ref 80.0–100.0)
Platelets: 247 K/uL (ref 150–400)
RBC: 4.75 MIL/uL (ref 4.22–5.81)
RDW: 12.6 % (ref 11.5–15.5)
WBC: 5.2 K/uL (ref 4.0–10.5)
nRBC: 0 % (ref 0.0–0.2)

## 2024-02-23 MED ORDER — CLINDAMYCIN PHOSPHATE 900 MG/50ML IV SOLN
900.0000 mg | INTRAVENOUS | Status: AC
  Administered 2024-02-24: 900 mg via INTRAVENOUS
  Filled 2024-02-23: qty 50

## 2024-02-23 MED ORDER — DEXTROSE 5 % IV SOLN
5.0000 mg/kg | INTRAVENOUS | Status: AC
  Administered 2024-02-24: 410 mg via INTRAVENOUS
  Filled 2024-02-23: qty 10.25

## 2024-02-24 ENCOUNTER — Ambulatory Visit (HOSPITAL_COMMUNITY): Payer: Self-pay | Admitting: Physician Assistant

## 2024-02-24 ENCOUNTER — Encounter (HOSPITAL_COMMUNITY): Payer: Self-pay | Admitting: Surgery

## 2024-02-24 ENCOUNTER — Observation Stay (HOSPITAL_COMMUNITY)
Admission: RE | Admit: 2024-02-24 | Discharge: 2024-02-25 | Disposition: A | Discharge status: Home / Self Care | Attending: Surgery | Admitting: Surgery

## 2024-02-24 ENCOUNTER — Encounter (HOSPITAL_COMMUNITY)
Admission: RE | Disposition: A | Discharge status: Home / Self Care | Payer: Self-pay | Source: Home / Self Care | Attending: Surgery

## 2024-02-24 ENCOUNTER — Other Ambulatory Visit: Payer: Self-pay

## 2024-02-24 ENCOUNTER — Inpatient Hospital Stay (HOSPITAL_COMMUNITY): Payer: Self-pay | Admitting: Anesthesiology

## 2024-02-24 DIAGNOSIS — K2270 Barrett's esophagus without dysplasia: Secondary | ICD-10-CM | POA: Diagnosis not present

## 2024-02-24 DIAGNOSIS — F1920 Other psychoactive substance dependence, uncomplicated: Secondary | ICD-10-CM | POA: Diagnosis not present

## 2024-02-24 DIAGNOSIS — K440 Diaphragmatic hernia with obstruction, without gangrene: Principal | ICD-10-CM | POA: Insufficient documentation

## 2024-02-24 DIAGNOSIS — F411 Generalized anxiety disorder: Secondary | ICD-10-CM | POA: Diagnosis not present

## 2024-02-24 DIAGNOSIS — Z87891 Personal history of nicotine dependence: Secondary | ICD-10-CM | POA: Insufficient documentation

## 2024-02-24 DIAGNOSIS — K219 Gastro-esophageal reflux disease without esophagitis: Secondary | ICD-10-CM | POA: Diagnosis not present

## 2024-02-24 HISTORY — PX: XI ROBOTIC ASSISTED PARAESOPHAGEAL HERNIA REPAIR: SHX6871

## 2024-02-24 SURGERY — REPAIR, HERNIA, PARAESOPHAGEAL, ROBOT-ASSISTED
Anesthesia: General

## 2024-02-24 MED ORDER — PANTOPRAZOLE SODIUM 40 MG PO TBEC
40.0000 mg | DELAYED_RELEASE_TABLET | Freq: Every day | ORAL | Status: DC
  Administered 2024-02-25: 40 mg via ORAL
  Filled 2024-02-24: qty 1

## 2024-02-24 MED ORDER — ORAL CARE MOUTH RINSE: 15.0000 mL | Freq: Once | OROMUCOSAL | Status: AC

## 2024-02-24 MED ORDER — ENSURE PRE-SURGERY PO LIQD: 296.0000 mL | Freq: Once | ORAL | Status: DC

## 2024-02-24 MED ORDER — SCOPOLAMINE 1 MG/3DAYS TD PT72
1.0000 | MEDICATED_PATCH | TRANSDERMAL | Status: DC
  Administered 2024-02-24: 1 mg via TRANSDERMAL
  Filled 2024-02-24: qty 1

## 2024-02-24 MED ORDER — FOLIC ACID 1 MG PO TABS
1.0000 mg | ORAL_TABLET | Freq: Every day | ORAL | Status: DC
  Administered 2024-02-24 – 2024-02-25 (×2): 1 mg via ORAL
  Filled 2024-02-24 (×2): qty 1

## 2024-02-24 MED ORDER — FENTANYL CITRATE (PF) 250 MCG/5ML IJ SOLN
INTRAMUSCULAR | Status: DC | PRN
  Administered 2024-02-24: 50 ug via INTRAVENOUS
  Administered 2024-02-24 (×2): 100 ug via INTRAVENOUS

## 2024-02-24 MED ORDER — FENTANYL CITRATE PF 50 MCG/ML IJ SOSY
PREFILLED_SYRINGE | INTRAMUSCULAR | Status: AC
  Filled 2024-02-24: qty 1

## 2024-02-24 MED ORDER — BUPIVACAINE-EPINEPHRINE (PF) 0.25% -1:200000 IJ SOLN
INTRAMUSCULAR | Status: DC | PRN
  Administered 2024-02-24: 80 mL

## 2024-02-24 MED ORDER — LACTATED RINGERS IV BOLUS: 1000.0000 mL | Freq: Three times a day (TID) | INTRAVENOUS | Status: DC | PRN

## 2024-02-24 MED ORDER — ONDANSETRON HCL 4 MG/2ML IJ SOLN
INTRAMUSCULAR | Status: AC
  Filled 2024-02-24: qty 2

## 2024-02-24 MED ORDER — LIDOCAINE HCL (PF) 2 % IJ SOLN
INTRAMUSCULAR | Status: AC
Start: 2024-02-24 — End: 2024-02-24
  Filled 2024-02-24: qty 5

## 2024-02-24 MED ORDER — PHENYLEPHRINE HCL-NACL 20-0.9 MG/250ML-% IV SOLN
INTRAVENOUS | Status: DC | PRN
  Administered 2024-02-24: 50 ug/min via INTRAVENOUS

## 2024-02-24 MED ORDER — DEXMEDETOMIDINE HCL IN NACL 80 MCG/20ML IV SOLN
INTRAVENOUS | Status: DC | PRN
  Administered 2024-02-24 (×2): 10 ug via INTRAVENOUS

## 2024-02-24 MED ORDER — PROCHLORPERAZINE EDISYLATE 10 MG/2ML IJ SOLN: 5.0000 mg | Freq: Four times a day (QID) | INTRAMUSCULAR | Status: DC | PRN

## 2024-02-24 MED ORDER — MIDAZOLAM HCL 2 MG/2ML IJ SOLN
INTRAMUSCULAR | Status: DC | PRN
  Administered 2024-02-24: 2 mg via INTRAVENOUS

## 2024-02-24 MED ORDER — KETAMINE HCL 50 MG/5ML IJ SOSY
PREFILLED_SYRINGE | INTRAMUSCULAR | Status: AC
  Filled 2024-02-24: qty 5

## 2024-02-24 MED ORDER — ROCURONIUM 10MG/ML (10ML) SYRINGE FOR MEDFUSION PUMP - OPTIME
INTRAVENOUS | Status: DC | PRN
  Administered 2024-02-24: 50 mg via INTRAVENOUS
  Administered 2024-02-24: 100 mg via INTRAVENOUS

## 2024-02-24 MED ORDER — HYDRALAZINE HCL 20 MG/ML IJ SOLN
INTRAMUSCULAR | Status: AC
  Filled 2024-02-24: qty 1

## 2024-02-24 MED ORDER — ACETAMINOPHEN 500 MG PO TABS: 1000.0000 mg | ORAL_TABLET | Freq: Once | ORAL | Status: DC

## 2024-02-24 MED ORDER — SUGAMMADEX SODIUM 200 MG/2ML IV SOLN
INTRAVENOUS | Status: AC
Start: 2024-02-24 — End: 2024-02-24
  Filled 2024-02-24: qty 2

## 2024-02-24 MED ORDER — LACTATED RINGERS IV SOLN
INTRAVENOUS | Status: DC
Start: 2024-02-24 — End: 2024-02-24

## 2024-02-24 MED ORDER — GABAPENTIN 100 MG PO CAPS
300.0000 mg | ORAL_CAPSULE | Freq: Two times a day (BID) | ORAL | Status: DC
  Administered 2024-02-24 – 2024-02-25 (×3): 300 mg via ORAL
  Filled 2024-02-24 (×3): qty 3

## 2024-02-24 MED ORDER — HYDRALAZINE HCL 20 MG/ML IJ SOLN
10.0000 mg | Freq: Once | INTRAMUSCULAR | Status: AC
  Administered 2024-02-24: 10 mg via INTRAVENOUS

## 2024-02-24 MED ORDER — PROPOFOL 10 MG/ML IV BOLUS
INTRAVENOUS | Status: AC
  Filled 2024-02-24: qty 20

## 2024-02-24 MED ORDER — GABAPENTIN 300 MG PO CAPS
300.0000 mg | ORAL_CAPSULE | ORAL | Status: AC
  Administered 2024-02-24: 300 mg via ORAL
  Filled 2024-02-24: qty 1

## 2024-02-24 MED ORDER — ONDANSETRON 4 MG PO TBDP: 4.0000 mg | ORAL_TABLET | Freq: Four times a day (QID) | ORAL | Status: DC | PRN

## 2024-02-24 MED ORDER — PHENOL 1.4 % MT LIQD: 2.0000 | OROMUCOSAL | Status: DC | PRN

## 2024-02-24 MED ORDER — KCL IN DEXTROSE-NACL 20-5-0.45 MEQ/L-%-% IV SOLN
INTRAVENOUS | Status: DC
  Filled 2024-02-24: qty 1000

## 2024-02-24 MED ORDER — BUPIVACAINE-EPINEPHRINE (PF) 0.25% -1:200000 IJ SOLN
INTRAMUSCULAR | Status: AC
  Filled 2024-02-24: qty 60

## 2024-02-24 MED ORDER — SUGAMMADEX SODIUM 200 MG/2ML IV SOLN
INTRAVENOUS | Status: AC
  Filled 2024-02-24: qty 2

## 2024-02-24 MED ORDER — MIDAZOLAM HCL 2 MG/2ML IJ SOLN
INTRAMUSCULAR | Status: AC
  Filled 2024-02-24: qty 2

## 2024-02-24 MED ORDER — ACETAMINOPHEN 500 MG PO TABS
1000.0000 mg | ORAL_TABLET | ORAL | Status: AC
  Administered 2024-02-24: 1000 mg via ORAL
  Filled 2024-02-24: qty 2

## 2024-02-24 MED ORDER — CYCLOBENZAPRINE HCL 5 MG PO TABS: 5.0000 mg | ORAL_TABLET | Freq: Three times a day (TID) | ORAL | 2 refills | Status: AC | PRN

## 2024-02-24 MED ORDER — CHLORHEXIDINE GLUCONATE 0.12 % MT SOLN
15.0000 mL | Freq: Once | OROMUCOSAL | Status: AC
  Administered 2024-02-24: 15 mL via OROMUCOSAL

## 2024-02-24 MED ORDER — ROCURONIUM BROMIDE 10 MG/ML (PF) SYRINGE
PREFILLED_SYRINGE | INTRAVENOUS | Status: AC
Start: 2024-02-24 — End: 2024-02-24
  Filled 2024-02-24: qty 10

## 2024-02-24 MED ORDER — ENOXAPARIN SODIUM 40 MG/0.4ML IJ SOSY
40.0000 mg | PREFILLED_SYRINGE | INTRAMUSCULAR | Status: DC
Start: 2024-02-25 — End: 2024-02-25
  Administered 2024-02-25: 40 mg via SUBCUTANEOUS
  Filled 2024-02-24: qty 0.4

## 2024-02-24 MED ORDER — METOCLOPRAMIDE HCL 5 MG PO TABS
5.0000 mg | ORAL_TABLET | Freq: Three times a day (TID) | ORAL | Status: DC | PRN
Start: 2024-02-24 — End: 2024-02-25

## 2024-02-24 MED ORDER — SALINE SPRAY 0.65 % NA SOLN
1.0000 | Freq: Four times a day (QID) | NASAL | Status: DC | PRN
Start: 2024-02-24 — End: 2024-02-25

## 2024-02-24 MED ORDER — HYDROMORPHONE HCL 1 MG/ML IJ SOLN
INTRAMUSCULAR | Status: DC | PRN
  Administered 2024-02-24: 1 mg via INTRAVENOUS

## 2024-02-24 MED ORDER — CYCLOBENZAPRINE HCL 5 MG PO TABS
5.0000 mg | ORAL_TABLET | Freq: Three times a day (TID) | ORAL | Status: DC | PRN
Start: 2024-02-24 — End: 2024-02-25
  Filled 2024-02-24: qty 1

## 2024-02-24 MED ORDER — MAGIC MOUTHWASH: 15.0000 mL | Freq: Four times a day (QID) | ORAL | Status: DC | PRN

## 2024-02-24 MED ORDER — PHENYLEPHRINE HCL (PRESSORS) 10 MG/ML IV SOLN
INTRAVENOUS | Status: DC | PRN
  Administered 2024-02-24 (×2): 80 ug via INTRAVENOUS

## 2024-02-24 MED ORDER — NAPHAZOLINE-GLYCERIN 0.012-0.25 % OP SOLN
1.0000 [drp] | Freq: Four times a day (QID) | OPHTHALMIC | Status: DC | PRN
Start: 2024-02-24 — End: 2024-02-25

## 2024-02-24 MED ORDER — METHOCARBAMOL 1000 MG/10ML IJ SOLN: 1000.0000 mg | Freq: Four times a day (QID) | INTRAMUSCULAR | Status: DC | PRN

## 2024-02-24 MED ORDER — PROCHLORPERAZINE MALEATE 10 MG PO TABS: 10.0000 mg | ORAL_TABLET | Freq: Four times a day (QID) | ORAL | Status: DC | PRN

## 2024-02-24 MED ORDER — SIMETHICONE 80 MG PO CHEW
80.0000 mg | CHEWABLE_TABLET | Freq: Four times a day (QID) | ORAL | Status: DC
Start: 2024-02-24 — End: 2024-02-27
  Administered 2024-02-24 – 2024-02-25 (×4): 80 mg via ORAL
  Filled 2024-02-24 (×5): qty 1

## 2024-02-24 MED ORDER — AMISULPRIDE (ANTIEMETIC) 5 MG/2ML IV SOLN: 10.0000 mg | Freq: Once | INTRAVENOUS | Status: DC | PRN

## 2024-02-24 MED ORDER — LIDOCAINE 2% (20 MG/ML) 5 ML SYRINGE
INTRAMUSCULAR | Status: DC | PRN
  Administered 2024-02-24: 60 mg via INTRAVENOUS

## 2024-02-24 MED ORDER — PROPOFOL 10 MG/ML IV BOLUS
INTRAVENOUS | Status: AC
Start: 2024-02-24 — End: 2024-02-24
  Filled 2024-02-24: qty 20

## 2024-02-24 MED ORDER — CHLORHEXIDINE GLUCONATE CLOTH 2 % EX PADS: 6.0000 | MEDICATED_PAD | Freq: Once | CUTANEOUS | Status: DC

## 2024-02-24 MED ORDER — ONDANSETRON HCL 4 MG/2ML IJ SOLN: 4.0000 mg | Freq: Once | INTRAMUSCULAR | Status: DC | PRN

## 2024-02-24 MED ORDER — EPHEDRINE 5 MG/ML INJ
INTRAVENOUS | Status: AC
  Filled 2024-02-24: qty 10

## 2024-02-24 MED ORDER — DEXAMETHASONE SODIUM PHOSPHATE 10 MG/ML IJ SOLN
INTRAMUSCULAR | Status: AC
  Filled 2024-02-24: qty 1

## 2024-02-24 MED ORDER — INFLUENZA VIRUS VACC SPLIT PF (FLUZONE) 0.5 ML IM SUSY: 0.5000 mL | PREFILLED_SYRINGE | INTRAMUSCULAR | Status: DC

## 2024-02-24 MED ORDER — FENTANYL CITRATE PF 50 MCG/ML IJ SOSY
25.0000 ug | PREFILLED_SYRINGE | INTRAMUSCULAR | Status: DC | PRN
  Administered 2024-02-24: 50 ug via INTRAVENOUS

## 2024-02-24 MED ORDER — DIPHENHYDRAMINE HCL 12.5 MG/5ML PO ELIX: 12.5000 mg | ORAL_SOLUTION | Freq: Four times a day (QID) | ORAL | Status: DC | PRN

## 2024-02-24 MED ORDER — HYDROMORPHONE HCL 2 MG/ML IJ SOLN
INTRAMUSCULAR | Status: AC
  Filled 2024-02-24: qty 1

## 2024-02-24 MED ORDER — ONDANSETRON HCL 4 MG PO TABS: 4.0000 mg | ORAL_TABLET | Freq: Three times a day (TID) | ORAL | 5 refills | Status: AC | PRN

## 2024-02-24 MED ORDER — ONDANSETRON HCL 4 MG/2ML IJ SOLN: 4.0000 mg | Freq: Four times a day (QID) | INTRAMUSCULAR | Status: DC | PRN

## 2024-02-24 MED ORDER — BUPIVACAINE LIPOSOME 1.3 % IJ SUSP: 20.0000 mL | Freq: Once | INTRAMUSCULAR | Status: DC

## 2024-02-24 MED ORDER — KETAMINE HCL 10 MG/ML IJ SOLN
INTRAMUSCULAR | Status: DC | PRN
  Administered 2024-02-24: 30 mg via INTRAVENOUS
  Administered 2024-02-24 (×2): 10 mg via INTRAVENOUS

## 2024-02-24 MED ORDER — METOCLOPRAMIDE HCL 5 MG/ML IJ SOLN: 5.0000 mg | Freq: Three times a day (TID) | INTRAMUSCULAR | Status: DC | PRN

## 2024-02-24 MED ORDER — SODIUM CHLORIDE 0.9 % IR SOLN
Status: DC | PRN
  Administered 2024-02-24: 1000 mL

## 2024-02-24 MED ORDER — KETOROLAC TROMETHAMINE 15 MG/ML IJ SOLN
15.0000 mg | Freq: Four times a day (QID) | INTRAMUSCULAR | Status: DC | PRN
  Administered 2024-02-24 (×2): 15 mg via INTRAVENOUS
  Filled 2024-02-24: qty 2
  Filled 2024-02-24: qty 1

## 2024-02-24 MED ORDER — BUPROPION HCL 75 MG PO TABS
75.0000 mg | ORAL_TABLET | Freq: Two times a day (BID) | ORAL | Status: DC
  Administered 2024-02-25: 75 mg via ORAL
  Filled 2024-02-24 (×2): qty 1

## 2024-02-24 MED ORDER — DEXAMETHASONE SODIUM PHOSPHATE 10 MG/ML IJ SOLN
8.0000 mg | Freq: Two times a day (BID) | INTRAMUSCULAR | Status: DC
  Administered 2024-02-24 – 2024-02-25 (×2): 8 mg via INTRAVENOUS
  Filled 2024-02-24 (×2): qty 1

## 2024-02-24 MED ORDER — SUCCINYLCHOLINE CHLORIDE 200 MG/10ML IV SOSY
PREFILLED_SYRINGE | INTRAVENOUS | Status: AC
Start: 2024-02-24 — End: 2024-02-24
  Filled 2024-02-24: qty 10

## 2024-02-24 MED ORDER — BISACODYL 10 MG RE SUPP: 10.0000 mg | Freq: Every day | RECTAL | Status: DC | PRN

## 2024-02-24 MED ORDER — BUPIVACAINE LIPOSOME 1.3 % IJ SUSP
INTRAMUSCULAR | Status: AC
  Filled 2024-02-24: qty 20

## 2024-02-24 MED ORDER — ONDANSETRON HCL 4 MG/2ML IJ SOLN
INTRAMUSCULAR | Status: DC | PRN
  Administered 2024-02-24: 4 mg via INTRAVENOUS

## 2024-02-24 MED ORDER — LACTATED RINGERS IR SOLN
Status: DC | PRN
  Administered 2024-02-24: 1000 mL

## 2024-02-24 MED ORDER — DEXAMETHASONE SODIUM PHOSPHATE 10 MG/ML IJ SOLN: 4.0000 mg | INTRAMUSCULAR | Status: DC

## 2024-02-24 MED ORDER — DIPHENHYDRAMINE HCL 50 MG/ML IJ SOLN: 12.5000 mg | Freq: Four times a day (QID) | INTRAMUSCULAR | Status: DC | PRN

## 2024-02-24 MED ORDER — METOPROLOL TARTRATE 5 MG/5ML IV SOLN: 5.0000 mg | Freq: Four times a day (QID) | INTRAVENOUS | Status: DC | PRN

## 2024-02-24 MED ORDER — SUCCINYLCHOLINE 20MG/ML (10ML) SYRINGE FOR MEDFUSION PUMP - OPTIME
INTRAMUSCULAR | Status: DC | PRN
  Administered 2024-02-24: 100 mg via INTRAVENOUS

## 2024-02-24 MED ORDER — SUGAMMADEX SODIUM 200 MG/2ML IV SOLN
INTRAVENOUS | Status: DC | PRN
  Administered 2024-02-24: 200 mg via INTRAVENOUS

## 2024-02-24 MED ORDER — PHENYLEPHRINE HCL (PRESSORS) 10 MG/ML IV SOLN
INTRAVENOUS | Status: AC
  Filled 2024-02-24: qty 1

## 2024-02-24 MED ORDER — MAGNESIUM HYDROXIDE 400 MG/5ML PO SUSP: 30.0000 mL | Freq: Every day | ORAL | Status: DC | PRN

## 2024-02-24 MED ORDER — PROPOFOL 10 MG/ML IV BOLUS
INTRAVENOUS | Status: DC | PRN
  Administered 2024-02-24: 150 mg via INTRAVENOUS

## 2024-02-24 MED ORDER — MENTHOL 3 MG MT LOZG: 1.0000 | LOZENGE | OROMUCOSAL | Status: DC | PRN

## 2024-02-24 MED ORDER — ENSURE PRE-SURGERY PO LIQD
592.0000 mL | Freq: Once | ORAL | Status: DC
Start: 2024-02-24 — End: 2024-02-24

## 2024-02-24 MED ORDER — DEXAMETHASONE SODIUM PHOSPHATE 10 MG/ML IJ SOLN
INTRAMUSCULAR | Status: DC | PRN
  Administered 2024-02-24: 10 mg via INTRAVENOUS

## 2024-02-24 MED ORDER — FENTANYL CITRATE (PF) 250 MCG/5ML IJ SOLN
INTRAMUSCULAR | Status: AC
  Filled 2024-02-24: qty 5

## 2024-02-24 MED ORDER — ACETAMINOPHEN 500 MG PO TABS
1000.0000 mg | ORAL_TABLET | Freq: Four times a day (QID) | ORAL | Status: DC
  Administered 2024-02-24 – 2024-02-25 (×4): 1000 mg via ORAL
  Filled 2024-02-24 (×4): qty 2

## 2024-02-24 MED ORDER — GABAPENTIN 300 MG PO CAPS: 300.0000 mg | ORAL_CAPSULE | Freq: Two times a day (BID) | ORAL | 1 refills | Status: AC

## 2024-02-24 MED ORDER — PHENYLEPHRINE HCL (PRESSORS) 10 MG/ML IV SOLN
INTRAVENOUS | Status: AC
Start: 2024-02-24 — End: 2024-02-24
  Filled 2024-02-24: qty 1

## 2024-02-24 SURGICAL SUPPLY — 70 items
APPLICATOR COTTON TIP 6 STRL (MISCELLANEOUS) ×1 IMPLANT
BLADE SURG SZ11 CARB STEEL (BLADE) ×1 IMPLANT
CHLORAPREP W/TINT 26 (MISCELLANEOUS) ×1 IMPLANT
CLIP APPLIE 5 13 M/L LIGAMAX5 (MISCELLANEOUS) IMPLANT
COVER SURGICAL LIGHT HANDLE (MISCELLANEOUS) ×1 IMPLANT
COVER TIP SHEARS 8 DVNC (MISCELLANEOUS) IMPLANT
DEFOGGER SCOPE WARM SEASHARP (MISCELLANEOUS) ×1 IMPLANT
DRAIN CHANNEL 19F RND (DRAIN) IMPLANT
DRAIN PENROSE 0.5X18 (DRAIN) IMPLANT
DRAPE ARM DVNC X/XI (DISPOSABLE) ×4 IMPLANT
DRAPE COLUMN DVNC XI (DISPOSABLE) ×1 IMPLANT
DRAPE WARM FLUID 44X44 (DRAPES) ×1 IMPLANT
DRIVER NDL LRG 8 DVNC XI (INSTRUMENTS) ×2 IMPLANT
DRIVER NDL MEGA SUTCUT DVNCXI (INSTRUMENTS) ×1 IMPLANT
DRIVER NDLE LRG 8 DVNC XI (INSTRUMENTS) IMPLANT
DRIVER NDLE MEGA SUTCUT DVNCXI (INSTRUMENTS) IMPLANT
DRSG TEGADERM 2-3/8X2-3/4 SM (GAUZE/BANDAGES/DRESSINGS) ×5 IMPLANT
ELECT REM PT RETURN 15FT ADLT (MISCELLANEOUS) ×1 IMPLANT
ENDOLOOP SUT PDS II 0 18 (SUTURE) IMPLANT
EVACUATOR DRAINAGE 10X20 100CC (DRAIN) IMPLANT
EVACUATOR SILICONE 100CC (DRAIN) IMPLANT
FELT TEFLON 4 X1 (Mesh General) ×1 IMPLANT
FILTER TUBING DISPOS CLIPPER (CLIP) IMPLANT
FORCEPS PROGRASP DVNC XI (FORCEP) ×1 IMPLANT
GAUZE SPONGE 2X2 8PLY STRL LF (GAUZE/BANDAGES/DRESSINGS) ×1 IMPLANT
GLOVE ECLIPSE 8.0 STRL XLNG CF (GLOVE) ×2 IMPLANT
GLOVE INDICATOR 8.0 STRL GRN (GLOVE) ×2 IMPLANT
GOWN STRL REUS W/ TWL XL LVL3 (GOWN DISPOSABLE) ×2 IMPLANT
GRASPER SUT TROCAR 14GX15 (MISCELLANEOUS) IMPLANT
GRASPER TIP-UP FEN DVNC XI (INSTRUMENTS) ×1 IMPLANT
IRRIGATION SUCT STRKRFLW 2 WTP (MISCELLANEOUS) ×1 IMPLANT
KIT BASIN OR (CUSTOM PROCEDURE TRAY) ×1 IMPLANT
KIT PROCEDURE OLYMPUS (MISCELLANEOUS) IMPLANT
KIT TURNOVER KIT A (KITS) ×1 IMPLANT
MARKER SKIN DUAL TIP RULER LAB (MISCELLANEOUS) ×1 IMPLANT
MESH BIO-A 7X10 SYN MAT (Mesh General) IMPLANT
NDL HYPO 22X1.5 SAFETY MO (MISCELLANEOUS) ×1 IMPLANT
NDL INSUFFLATION 14GA 120MM (NEEDLE) ×1 IMPLANT
NEEDLE HYPO 22X1.5 SAFETY MO (MISCELLANEOUS) ×1 IMPLANT
NEEDLE INSUFFLATION 14GA 120MM (NEEDLE) ×1 IMPLANT
PACK CARDIOVASCULAR III (CUSTOM PROCEDURE TRAY) ×1 IMPLANT
PAD POSITIONING PINK XL (MISCELLANEOUS) ×1 IMPLANT
PENCIL SMOKE EVACUATOR (MISCELLANEOUS) IMPLANT
SCISSORS LAP 5X45 EPIX DISP (ENDOMECHANICALS) IMPLANT
SCISSORS MNPLR CVD DVNC XI (INSTRUMENTS) ×1 IMPLANT
SEAL UNIV 5-12 XI (MISCELLANEOUS) ×3 IMPLANT
SEALER VESSEL EXT DVNC XI (MISCELLANEOUS) ×1 IMPLANT
SOLUTION ELECTROSURG ANTI STCK (MISCELLANEOUS) ×1 IMPLANT
SPIKE FLUID TRANSFER (MISCELLANEOUS) ×1 IMPLANT
SPONGE DRAIN TRACH 4X4 STRL 2S (GAUZE/BANDAGES/DRESSINGS) IMPLANT
STOPCOCK 4 WAY LG BORE MALE ST (IV SETS) ×2 IMPLANT
SUT ETHIBOND 0 36 GRN (SUTURE) ×2 IMPLANT
SUT ETHIBOND NAB CT1 #1 30IN (SUTURE) ×3 IMPLANT
SUT MNCRL AB 4-0 PS2 18 (SUTURE) ×1 IMPLANT
SUT PROLENE 2 0 SH DA (SUTURE) IMPLANT
SUT STRATA PDS 2-0 23 CT-1 (SUTURE) IMPLANT
SUT VIC AB 3-0 SH 27XBRD (SUTURE) IMPLANT
SUT VICRYL 0 UR6 27IN ABS (SUTURE) IMPLANT
SUT VLOC 180 2-0 6IN GS21 (SUTURE) IMPLANT
SUT VLOC 180 2-0 9IN GS21 (SUTURE) IMPLANT
SUT VLOC 180 3-0 9IN GS21 (SUTURE) IMPLANT
SUTURE VLOC BRB 180 ABS3/0GR12 (SUTURE) IMPLANT
SYR 10ML LL (SYRINGE) ×1 IMPLANT
SYR 20ML LL LF (SYRINGE) ×1 IMPLANT
TOWEL OR 17X26 10 PK STRL BLUE (TOWEL DISPOSABLE) ×1 IMPLANT
TRAY FOLEY MTR SLVR 14FR STAT (SET/KITS/TRAYS/PACK) IMPLANT
TRAY FOLEY MTR SLVR 16FR STAT (SET/KITS/TRAYS/PACK) IMPLANT
TROCAR ADV FIXATION 5X100MM (TROCAR) ×1 IMPLANT
TUBING ENDO SMARTCAP (MISCELLANEOUS) IMPLANT
TUBING INSUFFLATION 10FT LAP (TUBING) ×1 IMPLANT

## 2024-02-24 NOTE — Transfer of Care (Signed)
 Immediate Anesthesia Transfer of Care Note  Patient: Johnny Mills  Procedure(s) Performed: REPAIR, HERNIA, PARAESOPHAGEAL, ROBOT-ASSISTED  Patient Location: PACU  Anesthesia Type:General  Level of Consciousness: drowsy and patient cooperative  Airway & Oxygen Therapy: Patient Spontanous Breathing and Patient connected to nasal cannula oxygen  Post-op Assessment: Report given to RN and Post -op Vital signs reviewed and stable  Post vital signs: Reviewed and stable  Last Vitals:  Vitals Value Taken Time  BP 166/120 02/24/24 12:34  Temp    Pulse 106 02/24/24 12:36  Resp 20 02/24/24 12:36  SpO2 91 % 02/24/24 12:36  Vitals shown include unfiled device data.  Last Pain:  Vitals:   02/24/24 0659  TempSrc:   PainSc: 0-No pain      Patients Stated Pain Goal: 2 (02/24/24 0659)  Complications: No notable events documented.

## 2024-02-24 NOTE — Discharge Instructions (Signed)
 EATING AFTER YOUR ESOPHAGEAL SURGERY (Stomach Fundoplication, Hiatal Hernia repair, Achalasia surgery, etc)  ######################################################################  EAT Start with a pureed / full liquid diet (see below) Gradually transition to a high fiber diet with a fiber supplement over the next month after discharge.    WALK Walk an hour a day.  Control your pain to do that.    CONTROL PAIN Control pain so that you can walk, sleep, tolerate sneezing/coughing, go up/down stairs.  HAVE A BOWEL MOVEMENT DAILY Keep your bowels regular to avoid problems.  OK to try a laxative to override constipation.  OK to use an antidairrheal to slow down diarrhea.  Call if not better after 2 tries  CALL IF YOU HAVE PROBLEMS/CONCERNS Call if you are still struggling despite following these instructions. Call if you have concerns not answered by these instructions  ######################################################################   After your esophageal surgery, expect some sticking with swallowing over the next 1-2 months.    If food sticks when you eat, it is called dysphagia.  This is due to swelling around your esophagus at the wrap & hiatal diaphragm repair.  It will gradually ease off over the next few months.  To help you through this temporary phase, we start you out on a pureed (blenderized) diet.  Your first meal in the hospital was thin liquids.  You should have been given a pureed diet by the time you left the hospital.  We ask patients to stay on a pureed diet for the first 2-3 weeks to avoid anything getting stuck near your recent surgery.  Don't be alarmed if your ability to swallow doesn't progress according to this plan.  Everyone is different and some diets can advance more or less quickly.    It is often helpful to crush your medications or split them as they can sometimes stick, especially the first week or so.   Some BASIC RULES to follow are: Maintain  an upright position whenever eating or drinking. Take small bites - just a teaspoon size bite at a time. Eat slowly.  It may also help to eat only one food at a time. Consider nibbling through smaller, more frequent meals & avoid the urge to eat BIG meals Do not push through feelings of fullness, nausea, or bloatedness Do not mix solid foods and liquids in the same mouthful Try not to wash foods down with large gulps of liquids. Avoid carbonated (bubbly/fizzy) drinks.   Avoid foods that make you feel gassy or bloated.  Start with bland foods first.  Wait on trying greasy, fried, or spicy meals until you are tolerating more bland solids well. Understand that it will be hard to burp and belch at first.  This gradually improves with time.  Expect to be more gassy/flatulent/bloated initially.  Walking will help your body manage it better. Consider using medications for bloating that contain simethicone  such as  Maalox or Gas-X  Consider crushing her medications, especially smaller pills.  The ability to swallow pills should get easier after a few weeks Eat in a relaxed atmosphere & minimize distractions. Avoid talking while eating.   Do not use straws. Following each meal, sit in an upright position (90 degree angle) for 60 to 90 minutes.  Going for a short walk can help as well If food does stick, don't panic.  Try to relax and let the food pass on its own.  Sipping WARM LIQUID such as strong hot black tea can also help slide it down.  Be gradual in changes & use common sense:  -If you easily tolerating a certain level of foods, advance to the next level gradually -If you are having trouble swallowing a particular food, then avoid it.   -If food is sticking when you advance your diet, go back to thinner previous diet (the lower LEVEL) for 1-2 days.  LEVEL 1 = PUREED DIET  Do for the first 2 WEEKS AFTER SURGERY  -Foods in this group are pureed or blenderized to a smooth, mashed  potato-like consistency.  -If necessary, the pureed foods can keep their shape with the addition of a thickening agent.   -Meat should be pureed to a smooth, pasty consistency.  Hot broth or gravy may be added to the pureed meat, approximately 1 oz. of liquid per 3 oz. serving of meat. -CAUTION:  If any foods do not puree into a smooth consistency, swallowing will be more difficult.  (For example, nuts or seeds sometimes do not blend well.)  Hot Foods Cold Foods  Pureed scrambled eggs and cheese Pureed cottage cheese  Baby cereals Thickened juices and nectars  Thinned cooked cereals (no lumps) Thickened milk or eggnog  Pureed [place] toast or pancakes Ensure  Mashed potatoes Ice cream  Pureed parsley, au gratin, scalloped potatoes, candied sweet potatoes Fruit or [place] ice, sherbet  Pureed buttered or alfredo noodles Plain yogurt  Pureed vegetables (no corn or peas) Instant breakfast  Pureed soups and creamed soups Smooth pudding, mousse, custard  Pureed scalloped apples Whipped gelatin  Gravies Sugar, syrup, honey, jelly  Sauces, cheese, tomato, barbecue, white, creamed Cream  Any baby food Creamer  Alcohol in moderation (not beer or champagne) Margarine  Coffee or tea Mayonnaise   Ketchup, mustard   Apple sauce   SAMPLE MENU:  PUREED DIET Breakfast Lunch Dinner  Orange juice, 1/2 cup Cream of wheat, 1/2 cup Pineapple juice, 1/2 cup Pureed [place], barley soup, 3/4 cup Pureed Hawaiian chicken, 3 oz  Scrambled eggs, mashed or blended with cheese, 1/2 cup Tea or coffee, 1 cup  Whole milk, 1 cup  Non-dairy creamer, 2 Tbsp. Mashed potatoes, 1/2 cup Pureed cooled broccoli, 1/2 cup Apple sauce, 1/2 cup Coffee or tea Mashed potatoes, 1/2 cup Pureed spinach, 1/2 cup Frozen yogurt, 1/2 cup Tea or coffee      LEVEL 2 = SOFT DIET  After your first 2 weeks, you can advance to a soft diet.   Keep on this diet until everything goes down easily.  Hot Foods Cold Foods  White fish  Cottage cheese  Stuffed fish Junior baby fruit  Baby food meals Semi thickened juices  Minced soft cooked, scrambled, poached eggs nectars  Souffle & omelets Ripe mashed bananas  Cooked cereals Canned fruit, pineapple sauce, milk  potatoes Milkshake  Buttered or Alfredo noodles Custard  Cooked cooled vegetable Puddings, including tapioca  Sherbet Yogurt  Vegetable soup or alphabet soup Fruit ice, [place] ice  Gravies Whipped gelatin  Sugar, syrup, honey, jelly Junior baby desserts  Sauces:  Cheese, creamed, barbecue, tomato, white Cream  Coffee or tea Margarine   SAMPLE MENU:  LEVEL 2 Breakfast Lunch Dinner  Orange juice, 1/2 cup Oatmeal, 1/2 cup Scrambled eggs with cheese, 1/2 cup Decaffeinated tea, 1 cup Whole milk, 1 cup Non-dairy creamer, 2 Tbsp Pineapple juice, 1/2 cup Minced beef, 3 oz Gravy, 2 Tbsp Mashed potatoes, 1/2 cup Minced fresh broccoli, 1/2 cup Applesauce, 1/2 cup Coffee, 1 cup [place], barley soup, 3/4 cup Minced Hawaiian chicken, 3 oz  Mashed potatoes, 1/2 cup Cooked spinach, 1/2 cup Frozen yogurt, 1/2 cup Non-dairy creamer, 2 Tbsp      LEVEL 3 = CHOPPED DIET  -After all the foods in level 2 (soft diet) are passing through well you should advance up to more chopped foods.  -It is still important to cut these foods into small pieces and eat slowly.  Hot Foods Cold Foods  Poultry Cottage cheese  Chopped Swedish meatballs Yogurt  Meat salads (ground or flaked meat) Milk  Flaked fish (tuna) Milkshakes  Poached or scrambled eggs Soft, cold, dry cereal  Souffles and omelets Fruit juices or nectars  Cooked cereals Chopped canned fruit  Chopped [place] toast or pancakes Canned fruit cocktail  Noodles or pasta (no rice) Pudding, mousse, custard  Cooked vegetables (no frozen peas, corn, or mixed vegetables) Green salad  Canned small sweet peas Ice cream  Creamed soup or vegetable soup Fruit ice, [place] ice  Pureed vegetable soup or alphabet soup  Non-dairy creamer  Ground scalloped apples Margarine  Gravies Mayonnaise  Sauces:  Cheese, creamed, barbecue, tomato, white Ketchup  Coffee or tea Mustard   SAMPLE MENU:  LEVEL 3 Breakfast Lunch Dinner  Orange juice, 1/2 cup Oatmeal, 1/2 cup Scrambled eggs with cheese, 1/2 cup Decaffeinated tea, 1 cup Whole milk, 1 cup Non-dairy creamer, 2 Tbsp Ketchup, 1 Tbsp Margarine, 1 tsp Salt, 1/4 tsp Sugar, 2 tsp Pineapple juice, 1/2 cup Ground beef, 3 oz Gravy, 2 Tbsp Mashed potatoes, 1/2 cup Cooked spinach, 1/2 cup Applesauce, 1/2 cup Decaffeinated coffee Whole milk Non-dairy creamer, 2 Tbsp Margarine, 1 tsp Salt, 1/4 tsp Pureed turkey, barley soup, 3/4 cup Barbecue chicken, 3 oz Mashed potatoes, 1/2 cup Ground fresh broccoli, 1/2 cup Frozen yogurt, 1/2 cup Decaffeinated tea, 1 cup Non-dairy creamer, 2 Tbsp Margarine, 1 tsp Salt, 1/4 tsp Sugar, 1 tsp    LEVEL 4:  REGULAR FOODS  -Foods in this group are soft, moist, regularly textured foods.   -This level includes meat and breads, which tend to be the hardest things to swallow.   -Eat very slowly, chew well and continue to avoid carbonated drinks. -most people are at this level in 4-6 weeks  Hot Foods Cold Foods  Baked fish or skinned Soft cheeses - cottage cheese  Souffles and omelets Cream cheese  Eggs Yogurt  Stuffed shells Milk  Spaghetti with meat sauce Milkshakes  Cooked cereal Cold dry cereals (no nuts, dried fruit, coconut)  [place] toast or pancakes Crackers  Buttered toast Fruit juices or nectars  Noodles or pasta (no rice) Canned fruit  Potatoes (all types) Ripe bananas  Soft, cooked vegetables (no corn, lima, or baked beans) Peeled, ripe, fresh fruit  Creamed soups or vegetable soup Cakes (no nuts, dried fruit, coconut)  Canned chicken noodle soup Plain doughnuts  Gravies Ice cream  Bacon dressing Pudding, mousse, custard  Sauces:  Cheese, creamed, barbecue, tomato, white Fruit ice, [place] ice, sherbet   Decaffeinated tea or coffee Whipped gelatin  Pork chops Regular gelatin   Canned fruited gelatin molds   Sugar, syrup, honey, jam, jelly   Cream   Non-dairy   Margarine   Oil   Mayonnaise   Ketchup   Mustard   TROUBLESHOOTING IRREGULAR BOWELS  1) Avoid extremes of bowel movements (no bad constipation/diarrhea)  2) Miralax 17gm mixed in 8oz. water or juice-daily. May use BID as needed.  3) Gas-x,Phazyme, etc. as needed for gas & bloating.  4) Soft,bland diet. No spicy,greasy,fried foods.  5) Prilosec over-the-counter  as needed  6) May hold gluten/wheat products from diet to see if symptoms improve.  7) May try probiotics (Align, Activa, etc) to help calm the bowels down  7) If symptoms become worse call back immediately.    If you have any questions please call our office at [facility]: [phone].    ################################################################  LAPAROSCOPIC SURGERY: POST OP INSTRUCTIONS  ######################################################################  EAT Gradually transition to a high fiber diet with a fiber supplement over the next few weeks after discharge.  Start with a pureed / full liquid diet (see below)  WALK Walk an hour a day.  Control your pain to do that.    CONTROL PAIN Control pain so that you can walk, sleep, tolerate sneezing/coughing, go up/down stairs.  HAVE A BOWEL MOVEMENT DAILY Keep your bowels regular to avoid problems.  OK to try a laxative to override constipation.  OK to use an antidairrheal to slow down diarrhea.  Call if not better after 2 tries  CALL IF YOU HAVE PROBLEMS/CONCERNS Call if you are still struggling despite following these instructions. Call if you have concerns not answered by these instructions  ######################################################################    DIET: Follow a light bland diet & liquids the first 24 hours after arrival home, such as soup, liquids,  starches, etc.  Be sure to drink plenty of fluids.  Quickly advance to a usual solid diet within a few days.  Avoid fast food or heavy meals as your are more likely to get nauseated or have irregular bowels.  A low-fat, high-fiber diet for the rest of your life is ideal.  Take your usually prescribed home medications unless otherwise directed. Blood thinners:  You can restart any strong blood thinners after the second postoperative day  for example: COUMADIN (warfarin), XERELTO (rivaroxaban), ELIQUIS (apixaban), PLAVIX (clopidigrel), BRILINTA (ticagrelor), EFFIENT (prasugrel), PRADAXA (dabigatran), etc  Continue aspirin before & after surgery..     Some oozing/bleeding the first 1-2 weeks is common but should taper down & be small volume.    If you are passing many large clots or having uncontrolling bleeding, call your surgeon  PAIN CONTROL: Pain is best controlled by a usual combination of three different methods TOGETHER: Ice/Heat Over the counter pain medication Prescription pain medication Most patients will experience some swelling and bruising around the incisions.  Ice packs or heating pads (30-60 minutes up to 6 times a day) will help. Use ice for the first few days to help decrease swelling and bruising, then switch to heat to help relax tight/sore spots and speed recovery.  Some people prefer to use ice alone, heat alone, alternating between ice & heat.  Experiment to what works for you.  Swelling and bruising can take several weeks to resolve.   It is helpful to take an over-the-counter pain medication regularly for the first few weeks.  Choose one of the following that works best for you: Naproxen (Aleve, etc)  Two 220mg  tabs twice a day Ibuprofen  (Advil , etc) Three 200mg  tabs four times a day (every meal & bedtime) Acetaminophen  (Tylenol , etc) 500-650mg  four times a day (every meal & bedtime) A  prescription for pain medication (such as oxycodone, hydrocodone, tramadol,  gabapentin , methocarbamol , etc) should be given to you upon discharge.  Take your pain medication as prescribed.  If you are having problems/concerns with the prescription medicine (does not control pain, nausea, vomiting, rash, itching, etc), please call us  [phone] to see if we need to switch you to a different pain  medicine that will work better for you and/or control your side effect better. If you need a refill on your pain medication, please give us  48 hour notice.  contact your pharmacy.  They will contact our office to request authorization. Prescriptions will not be filled after 5 pm or on week-ends  AVOID GETTING CONSTIPATED.   a.  Between the surgery and the pain medications, it is common to experience some constipation.  b.  Drink plenty of liquids c   ake a fiber supplement 2 times day (such as Metamucil, Citrucel, FiberCon, MiraLax, etc) to have a bowel movement every day. d.  If you have not had a BM by 2 days after surgery: -drink liquids only until you have a bowel movement - take MiraLAX 2 doses every 2 hours until you have a bowel movement   Watch out for diarrhea.   If you have many loose bowel movements, simplify your diet to bland foods & liquids for a few days.   Stop any stool softeners and decrease your fiber supplement.   Switching to mild anti-diarrheal medications (Kayopectate, Pepto Bismol) can help.   If this worsens or does not improve, please call us .  Wash / shower every day.  You may shower over the dressings as they are waterproof.  Continue to shower over incision(s) after the dressing is off.  It is good for closed incisions and even open wounds to be washed every day.  Shower every day.  Short baths are fine.  Wash the incisions and wounds clean with soap & water.    You may leave closed incisions open to air if it is dry.   You may cover the incision with clean gauze & replace it after your daily shower for comfort.  TEGADERM:  You have clear gauze  band-aid dressings over your closed incision(s).  Remove the dressings 2 days after surgery = 9/26.    ACTIVITIES as tolerated:   You may resume regular (light) daily activities beginning the next day--such as daily self-care, walking, climbing stairs--gradually increasing activities as tolerated.  If you can walk 30 minutes without difficulty, it is safe to try more intense activity such as jogging, treadmill, bicycling, low-impact aerobics, swimming, etc. Save the most intensive and strenuous activity for last such as sit-ups, heavy lifting, contact sports, etc  Refrain from any heavy lifting or straining until you are off narcotics for pain control.   DO NOT PUSH THROUGH PAIN.  Let pain be your guide: If it hurts to do something, don't do it.  Pain is your body warning you to avoid that activity for another week until the pain goes down. You may drive when you are no longer taking prescription pain medication, you can comfortably wear a seatbelt, and you can safely maneuver your car and apply brakes. You may have sexual intercourse when it is comfortable.  FOLLOW UP in our office Please call [facility] at [phone] to set up an appointment to see your surgeon in the office for a follow-up appointment approximately 2-3 weeks after your surgery. Make sure that you call for this appointment the day you arrive home to insure a convenient appointment time.  10. IF YOU HAVE DISABILITY OR FAMILY LEAVE FORMS, BRING THEM TO THE OFFICE FOR PROCESSING.  DO NOT GIVE THEM TO YOUR DOCTOR.   WHEN TO CALL US  [phone]: Poor pain control Reactions / problems with new medications (rash/itching, nausea, etc)  Fever over 101.5 F (38.5 C) Inability to urinate  Nausea and/or vomiting Worsening swelling or bruising Continued bleeding from incision. Increased pain, redness, or drainage from the incision   The clinic staff is available to answer your questions during regular business hours (8:30am-5pm).   Please don't hesitate to call and ask to speak to one of our nurses for clinical concerns.   If you have a medical emergency, go to the nearest emergency room or call 911.  A surgeon from [facility] is always on call at the [facility], [place] [address], [address], [place], [place]  [address] ? MAIN: [phone] ? TOLL FREE: [phone] ?  FAX [phone] [email or web address]  ##############################################################

## 2024-02-24 NOTE — Anesthesia Postprocedure Evaluation (Signed)
 Anesthesia Post Note  Patient: Johnny Mills  Procedure(s) Performed: REPAIR, HERNIA, PARAESOPHAGEAL, ROBOT-ASSISTED     Patient location during evaluation: PACU Anesthesia Type: General Level of consciousness: awake and alert Pain management: pain level controlled Vital Signs Assessment: post-procedure vital signs reviewed and stable Respiratory status: spontaneous breathing, nonlabored ventilation, respiratory function stable and patient connected to nasal cannula oxygen Cardiovascular status: blood pressure returned to baseline and stable Postop Assessment: no apparent nausea or vomiting Anesthetic complications: no   No notable events documented.  Last Vitals:  Vitals:   02/24/24 1409 02/24/24 1504  BP: (!) 141/93 (!) 130/96  Pulse: (!) 105 (!) 104  Resp:    Temp: 36.9 C 36.5 C  SpO2: 96% 96%    Last Pain:  Vitals:   02/24/24 1504  TempSrc: Oral  PainSc:                  Garnette FORBES Skillern

## 2024-02-24 NOTE — Anesthesia Preprocedure Evaluation (Addendum)
 Anesthesia Evaluation  Patient identified by MRN, date of birth, ID band Patient awake    Reviewed: Allergy & Precautions, NPO status , Patient's Chart, lab work & pertinent test results  Airway Mallampati: I  TM Distance: >3 FB Neck ROM: Full    Dental  (+) Teeth Intact, Dental Advisory Given   Pulmonary former smoker   Pulmonary exam normal breath sounds clear to auscultation       Cardiovascular negative cardio ROS Normal cardiovascular exam Rhythm:Regular Rate:Normal     Neuro/Psych  PSYCHIATRIC DISORDERS Anxiety Depression    negative neurological ROS     GI/Hepatic hiatal hernia,GERD  Medicated,,(+)     substance abuse    Endo/Other  negative endocrine ROS    Renal/GU negative Renal ROS     Musculoskeletal  (+) Arthritis ,    Abdominal   Peds  Hematology negative hematology ROS (+)   Anesthesia Other Findings Day of surgery medications reviewed with the patient.  Reproductive/Obstetrics                              Anesthesia Physical Anesthesia Plan  ASA: 2  Anesthesia Plan: General   Post-op Pain Management: Tylenol  PO (pre-op)*   Induction: Intravenous, Rapid sequence and Cricoid pressure planned  PONV Risk Score and Plan: 3 and Midazolam , Dexamethasone  and Ondansetron   Airway Management Planned: Oral ETT  Additional Equipment: ClearSight  Intra-op Plan:   Post-operative Plan: Extubation in OR  Informed Consent: I have reviewed the patients History and Physical, chart, labs and discussed the procedure including the risks, benefits and alternatives for the proposed anesthesia with the patient or authorized representative who has indicated his/her understanding and acceptance.     Dental advisory given  Plan Discussed with: CRNA  Anesthesia Plan Comments: (2nd PIV after induction )         Anesthesia Quick Evaluation

## 2024-02-24 NOTE — H&P (Signed)
 02/24/2024      REFERRING PHYSICIAN: Self  Patient Care Team: [facility] as PCP - General (Family Medicine) Mills, Johnny Bitter, MD as Consulting Provider (General Surgery) Carlie Vaughan BIRCH, MD (Otolaryngology) Burnette Fallow, MD as Consulting Provider (Internal Medicine)  PROVIDER: ELSPETH BITTER SHELDON, MD  [facility] MRN: [id number] DOB: [birth year] DATE OF ENCOUNTER: 12/07/2023  SUBJECTIVE   Chief Complaint: Recheck (HH repair)   History of Present Illness: Johnny Mills is a 58 y.o. male who is seen today  as an office consultation at the request of Dr. Arch  for evaluation of incarcerated hiatal hernia .  Patient returns. I had seen him over 2 years ago with a symptomatic hiatal hernia. I recommended robotic hiatal hernia repair. Preoperative manometry to see if he can tolerate full versus partial fundoplication and make sure there were no other issues. Was in the process of switching insurance carriers back in 2023, so he held off. We had not heard from him since. Apparently patient had worsening nausea vomiting after eating some barbecue. Got more intense retching and was concerned. Coffee ground emesis. Persistent. Got admitted 6/8. Underwent endoscopy irritated stomach and even larger hiatal hernia noted. 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 Patient on 81 mg aspirin but no other NSAIDs. On pantoprazole  40 mg daily PPI. Recommend to increase to twice daily PPI and reconsider surgical repair.   He comes in today. He is feeling better. He is adjust his diet and try to avoid vomiting episodes. Still with some dysphagia. He is back down to just PPI once a day. He has had no new cardiac or pulmonary issues. 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 No bad constipation or diarrhea.  PRIOR NOTE 08/12/2021 Patient with heartburn reflux. Followed by [facility] gastroenterology. Bayley McMichael PA and Will Outlaw MD. Had vague abdominal  complaints. Ultrasound last year underwhelming. Had more persistent discomfort. Endorsing upper abdominal complaints. Recommendation made for work-up including endoscopy as well as upper GI. Endoscopy done revealed a large hiatal hernia at least 10 cm with paraesophageal component as well. Probable Barrett's esophagus as well. Upper GI confirms a large hiatal hernia with much of the stomach up in the chest. Based on the studies, surgical consultation was requested for evaluation. Former history of tobacco, alcohol, and other drug use. Abstinent from tobacco since 2017 and other substances since 2019. He has never had any abdominal surgeries. Originally from [place] .  Patient is here by himself. He notes that he has had persistent episodes of dysphagia with food sticking. Tends to be more solid stuff. Adjusted his proton pump inhibitor. He is trying to switch to smaller meals. Occasionally gets to the point where food will just stick and he has to force himself to vomit. Denies any fevers or chills. No constipation. He can walk 5 miles of any difficulty. Has mild physical activity. He is never had any abdominal surgery. No diabetes. No sleep apnea. He usually moves his bowels once a day. No history of Crohn's or inflammatory bowel disease.  Medical History:  Past Medical History:  Diagnosis Date  Anxiety  GERD (gastroesophageal reflux disease)   Patient Active Problem List  Diagnosis  Abnormal auditory perception of both ears  Barretts esophagus  Generalized anxiety disorder  History of tobacco use  Hyperlipidemia  Psychoactive substance dependence (CMS/HHS-HCC)  Incarcerated hiatal hernia  Nausea and vomiting   Past Surgical History:  Procedure Laterality Date  VASECTOMY 2016    Allergies  Allergen  Reactions  Duloxetine Other (See Comments)  Fluoxetine Other (See Comments)  Penicillamine Unknown  Penicillins Hives  Sulfa (Sulfonamide Antibiotics) Hives   Current Outpatient  Medications on File Prior to Visit  Medication Sig Dispense Refill  buPROPion  (WELLBUTRIN  XL) 150 MG XL tablet bupropion  HCl XL 150 mg 24 hr tablet, extended release TAKE 1 TABLET BY MOUTH EVERY DAY IN THE MORNING  pantoprazole  (PROTONIX ) 40 MG DR tablet Take 1 tablet by mouth once daily  aspirin 81 MG EC tablet 1 tablet  Herbal Supplement Take by mouth once daily Herbal Name: Herbal Supplement   No current facility-administered medications on file prior to visit.   No family history on file.   Social History   Tobacco Use  Smoking Status Former  Current packs/day: 0.00  Types: Cigarettes  Quit date: 2018  Years since quitting: 7.5  Smokeless Tobacco Never    Social History   Socioeconomic History  Marital status: Married  Tobacco Use  Smoking status: Former  Current packs/day: 0.00  Types: Cigarettes  Quit date: 2018  Years since quitting: 7.5  Smokeless tobacco: Never  Vaping Use  Vaping status: Unknown  Substance and Sexual Activity  Alcohol use: Not Currently  Drug use: Never   Social Drivers of Health   Food Insecurity: No Food Insecurity (11/08/2023)  Received from [facility]  Hunger Vital Sign  Within the past 12 months, you worried that your food would run out before you got the money to buy more.: Never true  Within the past 12 months, the food you bought just didn't last and you didn't have money to get more.: Never true  Transportation Needs: No Transportation Needs (11/08/2023)  [facility]  Lack of Transportation (Medical): No  Lack of Transportation (Non-Medical): No  Housing Stability: Unknown (12/07/2023)  Housing Stability Vital Sign  Homeless in the Last Year: No   ############################################################  Review of Systems: A complete review of systems (ROS) was obtained from the patient. I have reviewed this information and discussed as appropriate with the patient. See HPI as well  for other pertinent ROS.  Constitutional: No fevers, chills, sweats. Weight stable Eyes: No vision changes, No discharge HENT: No sore throats, nasal drainage Lymph: No neck swelling, No bruising easily Pulmonary: No cough, productive sputum CV: No orthopnea, PND Patient walks 5 miles without difficulty. No exertional chest/neck/shoulder/arm pain.  GI: No personal nor family history of GI/colon cancer, inflammatory bowel disease, irritable bowel syndrome, allergy such as Celiac Sprue, dietary/dairy problems, colitis, ulcers nor gastritis. No recent sick contacts/gastroenteritis. No travel outside the country. No changes in diet.  Renal: No UTIs, No hematuria Genital: No drainage, bleeding, masses Musculoskeletal: No severe joint pain. Good ROM major joints Skin: No sores or lesions Heme/Lymph: No easy bleeding. No swollen lymph nodes  OBJECTIVE   Vitals:  12/07/23 1512  Pulse: 104  Temp: 36.9 C (98.5 F)  SpO2: 96%  Weight: 97 kg (213 lb 12.8 oz)     Body mass index is 30.24 kg/m.  PHYSICAL EXAM:  Constitutional: Not cachectic. Hygeine adequate. Vitals signs as above.  Eyes: Pupils reactive, normal extraocular movements. Sclera nonicteric Neuro: CN II-XII intact. No major focal sensory defects. No major motor deficits. Lymph: No head/neck/groin lymphadenopathy Psych: No severe agitation. No severe anxiety. Judgment & insight Adequate, Oriented x4, HENT: Normocephalic, Mucus membranes moist. No thrush. Hearing: adequate Neck: Supple, No tracheal deviation. No obvious thyromegaly. Port wine stain on right lateral upper neck. Chest:  No pain to chest wall compression. Good respiratory excursion. No audible wheezing CV: Pulses intact. regular. No major extremity edema Ext: No obvious deformity or contracture. Edema: Not present. No cyanosis Skin: No major subcutaneous nodules. Warm and dry Musculoskeletal: Severe joint rigidity not present. No obvious clubbing. No digital  petechiae. Mobility: no assist device moving easily without restrictions  Abdomen: Flat Soft. Nondistended. Nontender. Hernia: Not present. Diastasis recti: Not present. No hepatomegaly. No splenomegaly.  Genital/Pelvic: Inguinal hernia: Not present. Inguinal lymph nodes: without lymphadenopathy nor hidradenitis.   Rectal: (Deferred)    ###################################################################  Labs, Imaging and Diagnostic Testing:  Located in 'Care Everywhere' section of Epic EMR chart  PRIOR [facility] CLINIC NOTES:  Not applicable  SURGERY NOTES:  Not applicable  PATHOLOGY:  Not applicable  Assessment and Plan:  DIAGNOSES:  Diagnoses and all orders for this visit:  Barrett's esophagus with dysplasia - Amb Referral to Esophageal Manometry  Incarcerated hiatal hernia - Amb Referral to Esophageal Manometry  Psychoactive substance dependence (CMS/HHS-HCC) - Amb Referral to Esophageal Manometry  Hematemesis with nausea - Amb Referral to Esophageal Manometry  Other orders - ondansetron  (ZOFRAN -ODT) 4 MG disintegrating tablet; Take 1 tablet (4 mg total) by mouth every 8 (eight) hours as needed for Nausea for up to 7 days    ASSESSMENT/PLAN  Pleasant active male with worsening episodes of heartburn reflux and dysphagia with intermittent vomiting. Found to have large hiatal hernia by endoscopy and confirmed by upper GI/esophagram.  Manometry w normal esophageal function.    Given his relatively young age with worsening symptoms and large hiatal hernia I again recommended surgical repair. Minimally invasive robotic reduction and repair. Primary closure of the diaphragmatic repair. Low threshold to consider BioA vs Phasix absorbable mesh reinforcement given the moderate size, obesity, and relatively young age.  The anatomy & physiology of the foregut and anti-reflux mechanism was discussed. The pathophysiology of hiatal herniation and GERD was discussed. Natural  history risks without surgery was discussed. The patient's symptoms are not adequately controlled by medicines and other non-operative treatments. I feel the risks of no intervention will lead to serious problems that outweigh the operative risks; therefore, I recommended surgery to reduce the hiatal hernia out of the chest and fundoplication to rebuild the anti-reflux valve and control reflux better. Need for a thorough workup to rule out the differential diagnosis and plan treatment was explained. I explained minimally invasive techniques with possible need for an open approach.  Risks such as bleeding, infection, abscess, leak,injury to other organs, need for repair of tissues / organs, need for further treatment, stroke, heart attack, death, and other risks were discussed. I noted a good likelihood this will help address the problem. Goals of post-operative recovery were discussed as well. Possibility that this will not correct all symptoms was explained. Post-operative dysphagia, need for short-term liquid & pureed diet, inability to vomit, possibility of reherniation, possible need for medicines to help control symptoms in addition to surgery were discussed. We will work to minimize complications. Educational handouts further explaining the pathology, treatment options, and dysphagia diet was given as well. Questions were answered. The patient expresses understanding & wishes to proceed with surgery.  Patient has h/o drug addiction and is in recovery would like to minimize narcotics. That is understandable. He also has had a lot of gastric bleeding, so NSAIDs are off the table as well. Hopefully we can do a combination of acetaminophen  and muscle relaxers and possibly gabapentin  to help get him through the perioperative  discomfort. I did caution usually offer some tramadol at least. See if he can get through it safely.  Johnny KYM Schultze, MD, FACS, MASCRS Esophageal, Gastrointestinal & Colorectal  Surgery Robotic and Minimally Invasive Surgery  [facility] [address], [address] [place], [place] [address] [phone] Fax [phone] Main  CONTACT INFORMATION: Weekday (9AM-5PM): Call [facility] main office at [phone] Weeknight (5PM-9AM) or Weekend/Holiday: Check EPIC Web Links tab & use AMION (password  [email or web address]) for General Surgery [facility] coverage  Please, DO NOT use SecureChat  (it is not reliable communication to reach operating surgeons & will lead to a delay in care).   Epic staff messaging available for outptient concerns needing 1-2 business day response.     02/24/2024

## 2024-02-24 NOTE — Anesthesia Procedure Notes (Signed)
 Procedure Name: Intubation Date/Time: 02/24/2024 8:15 AM  Performed by: Nanci Riis, CRNAPre-anesthesia Checklist: Patient identified, Emergency Drugs available, Suction available, Patient being monitored and Timeout performed Patient Re-evaluated:Patient Re-evaluated prior to induction Oxygen Delivery Method: Circle system utilized Preoxygenation: Pre-oxygenation with 100% oxygen Induction Type: IV induction, Rapid sequence and Cricoid Pressure applied Laryngoscope Size: Miller and 3 Grade View: Grade I Tube type: Oral Tube size: 7.5 mm Number of attempts: 1 Airway Equipment and Method: Stylet Placement Confirmation: ETT inserted through vocal cords under direct vision, positive ETCO2 and breath sounds checked- equal and bilateral Secured at: 22 cm Tube secured with: Tape Dental Injury: Teeth and Oropharynx as per pre-operative assessment

## 2024-02-24 NOTE — Interval H&P Note (Signed)
 History and Physical Interval Note:  02/24/2024 7:36 AM  Johnny Mills  has presented today for surgery, with the diagnosis of PARAESOPHAGEAL HIATAL HERNIA REFRACTORY TO MEIDCAL MANAGEMENT.  The various methods of treatment have been discussed with the patient and family. After consideration of risks, benefits and other options for treatment, the patient has consented to  Procedure(s): REPAIR, HERNIA, PARAESOPHAGEAL, ROBOT-ASSISTED (N/A) as a surgical intervention.  The patient's history has been reviewed, patient examined, no change in status, stable for surgery.  I have reviewed the patient's chart and labs.  Questions were answered to the patient's satisfaction.    I have re-reviewed the the patient's records, history, medications, and allergies.  I have re-examined the patient.  I again discussed intraoperative plans and goals of post-operative recovery.  The patient agrees to proceed.  Johnny Mills  [birth year] [id number]  Patient Care Team: Dayna Motto, DO as PCP - General (Family Medicine) Mollie Nestor CHRISTELLA DEVONNA as Physician Assistant (Gastroenterology) Sheldon Standing, MD as Consulting Physician (General Surgery) Burnette Fallow, MD as Consulting Physician (Gastroenterology) Carlie Clark, MD as Consulting Physician (Otolaryngology)  Patient Active Problem List   Diagnosis Date Noted   Hiatal hernia 11/10/2023   Dehydration 11/09/2023   Upper GI bleed 11/08/2023   History of tobacco use 08/12/2021   Generalized anxiety disorder 08/12/2021   Barretts esophagus 08/12/2021   Gastroesophageal reflux disease 08/12/2021   Hyperlipidemia 08/12/2021   Psychoactive substance dependence (HCC) 08/12/2021   Incarcerated hiatal hernia 08/12/2021   Abnormal auditory perception of both ears 10/12/2019   Osteoarthritis of carpometacarpal [facility]) joint of thumb 11/10/2017    Past Medical History:  Diagnosis Date   Anxiety    Depression    GERD (gastroesophageal reflux disease)    History  of hiatal hernia    Substance abuse (HCC)     Past Surgical History:  Procedure Laterality Date   COLONOSCOPY     ESOPHAGOGASTRODUODENOSCOPY N/A 11/10/2023   Procedure: EGD (ESOPHAGOGASTRODUODENOSCOPY);  Surgeon: Saintclair Jasper, MD;  Location: [facility];  Service: Gastroenterology;  Laterality: N/A;   FRACTURE SURGERY      Social History   Socioeconomic History   Marital status: Married    Spouse name: Not on file   Number of children: Not on file   Years of education: Not on file   Highest education level: Not on file  Occupational History   Not on file  Tobacco Use   Smoking status: Former   Smokeless tobacco: Never  Vaping Use   Vaping status: Some Days  Substance and Sexual Activity   Alcohol use: Not Currently   Drug use: Not Currently   Sexual activity: Not on file  Other Topics Concern   Not on file  Social History Narrative   Not on file   Social Drivers of Health   Financial Resource Strain: Not on file  Food Insecurity: No Food Insecurity (11/08/2023)   Hunger Vital Sign    Worried About Running Out of Food in the Last Year: Never true    Ran Out of Food in the Last Year: Never true  Transportation Needs: No Transportation Needs (11/08/2023)   PRAPARE - Administrator, Civil Service (Medical): No    Lack of Transportation (Non-Medical): No  Physical Activity: Not on file  Stress: Not on file  Social Connections: Not on file  Intimate Partner Violence: Not At Risk (11/08/2023)   Humiliation, Afraid, Rape, and Kick questionnaire    Fear of Current or Ex-Partner: No  Emotionally Abused: No    Physically Abused: No    Sexually Abused: No    History reviewed. No pertinent family history.  Medications Prior to Admission  Medication Sig Dispense Refill Last Dose/Taking   buPROPion  (WELLBUTRIN  XL) 150 MG 24 hr tablet Take 150 mg by mouth every morning.   02/24/2024 at  5:45 AM   folic acid  (FOLVITE ) 1 MG tablet Take 1 mg by mouth daily.   Past  Week   pantoprazole  (PROTONIX ) 40 MG tablet Take 40 mg by mouth daily.   02/24/2024 at  5:45 AM   Saw Palmetto, Serenoa repens, (SAW PALMETTO PO) Take 1 Dose by mouth daily.   02/19/2024    Current Facility-Administered Medications  Medication Dose Route Frequency Provider Last Rate Last Admin   bupivacaine  liposome (EXPAREL ) 1.3 % injection 266 mg  20 mL Infiltration Once Sheldon Standing, MD       Chlorhexidine  Gluconate Cloth 2 % PADS 6 each  6 each Topical Once Sheldon Standing, MD       clindamycin  (CLEOCIN ) IVPB 900 mg  900 mg Intravenous On Call to OR Sheldon Standing, MD       And   gentamicin  (GARAMYCIN ) 410 mg in dextrose  5 % 100 mL IVPB  5 mg/kg (Adjusted) Intravenous On Call to OR Sheldon Standing, MD       dexamethasone  (DECADRON ) injection 4 mg  4 mg Intravenous On Call to OR Sheldon Standing, MD       lactated ringers  infusion   Intravenous Continuous Darlyn Rush, MD 10 mL/hr at 02/24/24 0716 New Bag at 02/24/24 0716   scopolamine  (TRANSDERM-SCOP) 1 MG/3DAYS 1 mg  1 patch Transdermal On Call to OR Sheldon Standing, MD   1 mg at 02/24/24 0700     Allergies  Allergen Reactions   Duloxetine Other (See Comments)   Duloxetine Hcl Other (See Comments)    fatigue   Escitalopram Other (See Comments)   Fluoxetine Other (See Comments)    fatigue   Penicillamine Other (See Comments)   Penicillins Hives and Other (See Comments)   Sulfa Antibiotics Hives    BP (!) 127/98   Pulse 91   Temp 98 F (36.7 C) (Oral)   Resp 18   Ht 5' 11 (1.803 m)   Wt 93.4 kg   SpO2 96%   BMI 28.73 kg/m   Labs: No results found for this or any previous visit (from the past 48 hours).  Imaging / Studies: No results found.   Briant KYM Sheldon, M.D., F.A.C.S. Gastrointestinal and Minimally Invasive Surgery [facility] [address], [address] [place], [place] [address] [phone] Main / Paging  02/24/2024 7:36 AM    Standing JAYSON Sheldon

## 2024-02-24 NOTE — Plan of Care (Signed)
   Problem: Health Behavior/Discharge Planning: Goal: Ability to manage health-related needs will improve Outcome: Progressing   Problem: Clinical Measurements: Goal: Diagnostic test results will improve Outcome: Progressing Goal: Respiratory complications will improve Outcome: Progressing   Problem: Nutrition: Goal: Adequate nutrition will be maintained Outcome: Progressing

## 2024-02-24 NOTE — Op Note (Signed)
 02/24/2024  12:20 PM  PATIENT:  Johnny Mills  58 y.o. male  Patient Care Team: Dayna Motto, DO as PCP - General (Family Medicine) Burnette Fallow, MD as Consulting Physician (Gastroenterology) Carlie Clark, MD as Consulting Physician (Otolaryngology) Sheldon Standing, MD as Consulting Physician (General Surgery) Burnette Fallow, MD as Consulting Physician (Gastroenterology)  PRE-OPERATIVE DIAGNOSIS:  PARAESOPHAGEAL HIATAL HERNIA REFRACTORY TO MEDICAL MANAGEMENT  POST-OPERATIVE DIAGNOSIS:  PARAESOPHAGEAL HIATAL HERNIA REFRACTORY TO MEDICAL MANAGEMENT  PROCEDURE:  Procedure(s): REPAIR, HERNIA, PARAESOPHAGEAL, ROBOT-ASSISTED  1. ROBOTIC reduction of paraesophageal hiatal hernia 2. Type II mediastinal dissection. 3. Bilateral crural release 4. Primary repair of hiatal hernia.  5. Anterior & posterior gastropexy. 6. Nissen (360 degree x 2cm over a Bougie)  fundoplication 7. Mesh reinforcement with absorbable mesh 8.  Partial omentectomy  SURGEON:  Standing KYM Sheldon, MD  ASSISTANT:  Bernarda Ned, MD  An experienced assistant was required given the standard of surgical care given the complexity of the case.  This assistant was needed for exposure, dissection, suction, tissue approximation, retraction, perception, etc  ANESTHESIA:  General endotracheal intubation anesthesia (GETA) and Local & regional field block at incision(s) for perioperative & postoperative pain control provided with liposomal bupivacaine  (Experel) 20mL mixed with 60mL of bupivicaine 0.25% with epinephrine   Estimated Blood Loss (EBL):   Total I/O In: 1000 [I.V.:1000] Out: - .   (See anesthesia record)  Delay start of Pharmacological VTE agent (>24hrs) due to concerns of significant anemia, surgical blood loss, or risk of bleeding?:  no  DRAINS: (None) and 19 Fr Blake drain with tip resting in the mediastinum  SPECIMEN:  Hernia sac (not sent)  DISPOSITION OF SPECIMEN:  (not applicable)  COUNTS:  Sponge,  needle, & instrument counts CORRECT at the conclusion of the case.      PLAN OF CARE: Admit to inpatient   PATIENT DISPOSITION:  PACU - hemodynamically stable.  INDICATION:   Patient with symptomatic paraesophageal hiatal hernia.  The patient has had extensive work-up & we feel the patient will benefit from repair:  The anatomy & physiology of the foregut and anti-reflux mechanism was discussed.  The pathophysiology of hiatal herniation and GERD was discussed.  Natural history risks without surgery was discussed.   The patient's symptoms are not adequately controlled by medicines and other non-operative treatments.  I feel the risks of no intervention will lead to serious problems that outweigh the operative risks; therefore, I recommended surgery to reduce the hiatal hernia out of the chest and fundoplication to rebuild the anti-reflux valve and control reflux better.  Need for a thorough workup to rule out the differential diagnosis and plan treatment was explained.  I explained laparoscopic techniques with possible need for an open approach.  Risks such as bleeding, infection, abscess, leak, need for further treatment, heart attack, death, and other risks were discussed.   I noted a good likelihood this will help address the problem.  Goals of post-operative recovery were discussed as well.  Possibility that this will not correct all symptoms was explained.  Post-operative dysphagia, need for short-term liquid & pureed diet, inability to vomit, possibility of reherniation, possible need for medicines to help control symptoms in addition to surgery were discussed.  We will work to minimize complications.   Educational handouts further explaining the pathology, treatment options, and dysphagia diet was given as well.  Questions were answered.  The patient expresses understanding & wishes to proceed with surgery.  OR FINDINGS:   Moderate-sized paraesophageal hiatal defect incarcerated with 80%  of the  stomach in the mediastinum.  Moderate volume of omentum as well.  There was a 10 x 8 cm hiatal defect.  It is a primary repair over pledgets posteriorly and primary suture only anteriorly.  Mesh reinforcement was used with GORE BIO-A mesh, a biosynthetic web scaffold made of 67% polyglycolic acid (PGA): 33% trimethylene carbonate (TMC).  The patient has a Nissen (360 degree x 2cm over a Bougie)  fundoplication.  The patient has had anterior and posterior gastropexy.  DESCRIPTION:   Informed consent was confirmed.  The patient received IV antibiotics prior to incision.  The underwent general anesthesia without difficulty.  A Foley catheter sterilely placed.  The patient was positioned in split leg with arms tucked. The abdomen was prepped and draped in the sterile fashion.  Surgical time-out confirmed our plan.  I placed a 5 mm port in the left subcostal region using Varess entry technique with the patient in steep reverse Trendelenburg and left side up.  Entry was clean.  We induced carbon dioxide insufflation.  Camera inspection revealed no injury.  Under direct visualization, I placed extra ports.  I also placed a 5 mm port in the left subxiphoid region under direct visualization.  I removed that and placed an Omega-shaped rigid Nathanson liver retractor to lift the left lateral sector of the liver anteriorly to expose the esophageal hiatus.  This was secured to the bed using the iron man system.  The Xi robot was carefully docked and instruments placed and advanced under direct visualization.  We focused on dissection.  We grasped the anterior mediastinal sac at the apex of the crus.  I scored through that and got into the anterior mediastinum.  I was able to free the mediastinal sac from its attachments to the pericardium and bilateral pleura using primarily focused gentle blunt dissection as well as focused vessel sealer dissection.  I transected phrenoesophageal attachments to the inner right  crus, preserving a two centimeter cuff of mediastinal sac until I found the base of the crura.  I then came around anteriorly on the left side and freed up the phrenoesophageal attachments of the mediastinal sac on the medial part of the left crus on the superior half.  I did careful mediastinal dissection to free the mediastinal sac.  With that, we could relieve the suction cup affect of the hernia sac and help reduce the stomach back down into the abdomen, flipped back approriately.  Patient had extremely thickened hernia sacs anteriorly and posteriorly with a large volume of fat.  Took extra care to free the use off layer by layer and trim them off.  That allowed better exposure.  We ligated the short gastrics along the lesser curvature of the stomach about a third the way down and then came up proximally over the fundus.  We released the attachments of the stomach to the retroperitoneum until we were able to connect with the prior dissection on the left crus.  We completed the release of phrenoesophageal attachments to the medial part of the left crus down to its base.  With this, we had circumferential mobilization.  Freed off the posterior hernia sac as well.  Patient had a giant posterior epiphrenic fat pad they carefully skeletonized and freed off.  Patient had a tight J shaped stomach.  I ended up freeing hernia sac along the lesser curve in a perpendicular fashion to help on curve and straighten out the stomach so it had a natural curve instead of  tightly kinked and chronic mesenteric-axial volvulization.  Patient also had a moderate volume of greater omentum that Wanted to come up into the mediastinum that I had to resect to move out.  Some of it was mildly ischemic from chronic incarceration as well.  That allowed better visualization of the esophageal hiatus.  We placed the stomach and esophagus on axial tension.  I then did a Type II mediastinal dissection where I freed the esophagus from its  attachments to the aorta, spine, pleura, and pericardium using primarily gentle blunt as well as focused ultrasonic dissection.  We saw the anterior & posterior vagus nerves intact.  We preserved it at all times.  I procedded to dissect about 25 cm proximally into the mediastinum.  With that I could straighten out the esophagus and get 5 cm of intra-abdominal length of the esophagus at a best estimation.  I did have to get into the right medial pleura to get enough release and relaxation.  Patient had moderate volume of mediastinal fat and even some fat along the esophagus although no classic lipoma.  I freed the anterior mediastinal sac off the esophagus & stomach.  We saw the anterior vagus nerve and further trimmed and resected remaining anterior hernia sac off of the vagus.  I dissected out & removed the the rest of the fatty epiphrenic pads at the esophagogastric junction. With that, I could better define the esophagogastric junction.  I confirmed the the patient had 5 cm of intra-abdominal esophageal length off tension.  Patient did have rather thickened visceral peritoneum on the left crura especially and less on the right.  It was rather circular defect so I ended up having to release the crura.  On the right side 4 cm away from the medial edge of the right crus and came through the anterior fascia more than a centimeter away from the IVC and transected through the fascia in the sagittal plane.  With that I got 1.5-2.5 cm release.  I then focused on the left diaphragm and did a similar sagittal resection through the inferior peritoneal and fascial coverings of the diaphragm incur about 5 cm from the medial edge of the left crus.  With that I got 2-3.5 cm release.  Without the crura come together much better.  There is a 1 cm abrasion on the mid pole of the anterior spleen and I can see that had mild oozing.  I was able to control this with robotic bipolar to good result.  I brought the fundus of the  stomach posterior to the esophagus over to the right side.  The wrap was mobile with the classic shoe shine maneuver.  Wrap became together gently.  We reflected the stomach left laterally and closed the esophageal hiatus using #1 Ethibond stitch using horizontal mattress stitches with pledgets on both sides.  I did that x2 stitches.  I then did a third #1 Ethibond horizontal mattress suture to close the hiatal hernia at the most superior junction without pledgets.  The crura had good substance and they came together well without any tension.  Did a fourth stitch to help bring things together little better.  Because of the larger defect in the setting of obesity and need for crural releases, I reinforced the repair using a Bio-A 10x7 cm biosynthetic precut mesh. We brought the mesh in and laid it over the crural repair, tails anterior over the crura.  I tucked the broader U tail of the mesh between the left  diaphragm and the spleen, the narrower U tail over the right crus.  I secured to the left lateral and left superior sides of the broader U tail to the left diaphragm band with 2-0 V lock running suture.  Secured the narrow towel to the right crus using a running 2-0 V-lock suture as well  I brought the fundus of the stomach behind the esophagus and cardia to set up a fundoplication wrap.  I did a posterior gastropexy x3  by taking 0 Ethibond interrupted stitches to the posterior part of the right side of the wrap and thru the mesh and crural closure.  I placed a stitch on the upper inner left crus and stitched that to the left anterior aspect of the planned fundoplication for a good left anterior gastropexy.  With the anterior and posterior gastropexies, stomach laid well for a fundoplication wrap.  Given his normal normal tree with Barrett's esophagus I said to be more aggressive with a Nissen fundoplication.  Anesthesia passed a 60 bougie transorally with myself holding the esophagus & proximal  stomach robotically under axial tension.  The bougue passed down easily without resistance.    I then did a classic 2 cm Nissen fundoplication on the true esophagus above the cardia using Ethibond stitch in the left side of the wrap, then anterior esophagus, and then right side of the wrap and tied that down. Did 3 stitches.  I measured it and it was 2 cm in length.  We removed the bougie.  It was intact.    The wrap was soft and floppy.   I placed a drain as noted above.  I did irrigation and ensured hemostasis.  I saw no evidence of any leak or perforation or other abnormality.  I removed the [facility] liver retractor under direct visualization.  I evacuated carbon dioxide and removed the ports.  The skin was closed with Monocryl and sterile dressings applied.  The patient is being extubated and brought back to the recovery room.  I discussed postop care in detail with the patient and family in in the office.  Discussed again with the patient & his wife, Arrington Yohe ,in the holding area.  I discussed with the patient's wife again.  Explained intraoperative findings and postop plan.  Questions answered.  She expressed understanding and appreciation.   Elspeth KYM Schultze, M.D., F.A.C.S. Gastrointestinal and Minimally Invasive Surgery [facility] [address], [address] [place], [place] [address] [phone] Main / Paging   02/24/2024

## 2024-02-25 ENCOUNTER — Observation Stay (HOSPITAL_COMMUNITY)

## 2024-02-25 ENCOUNTER — Encounter (HOSPITAL_COMMUNITY): Payer: Self-pay | Admitting: Surgery

## 2024-02-25 DIAGNOSIS — F325 Major depressive disorder, single episode, in full remission: Secondary | ICD-10-CM | POA: Insufficient documentation

## 2024-02-25 DIAGNOSIS — K440 Diaphragmatic hernia with obstruction, without gangrene: Secondary | ICD-10-CM | POA: Diagnosis not present

## 2024-02-25 DIAGNOSIS — E669 Obesity, unspecified: Secondary | ICD-10-CM | POA: Insufficient documentation

## 2024-02-25 DIAGNOSIS — R7303 Prediabetes: Secondary | ICD-10-CM | POA: Insufficient documentation

## 2024-02-25 DIAGNOSIS — Z87891 Personal history of nicotine dependence: Secondary | ICD-10-CM | POA: Insufficient documentation

## 2024-02-25 MED ORDER — FUROSEMIDE 10 MG/ML IJ SOLN
40.0000 mg | Freq: Once | INTRAMUSCULAR | Status: AC
  Administered 2024-02-25: 40 mg via INTRAVENOUS
  Filled 2024-02-25: qty 4

## 2024-02-25 MED ORDER — IOHEXOL 300 MG/ML  SOLN
100.0000 mL | Freq: Once | INTRAMUSCULAR | Status: AC | PRN
Start: 2024-02-25 — End: 2024-02-25
  Administered 2024-02-25: 100 mL via ORAL

## 2024-02-25 MED ORDER — IOHEXOL 300 MG/ML  SOLN
50.0000 mL | Freq: Once | INTRAMUSCULAR | Status: DC | PRN
  Administered 2024-02-25: 125 mL via ORAL

## 2024-02-25 NOTE — Progress Notes (Signed)
 Patient discharged home, IV removed, JP drain removed, discharge paperwork provided and explained to patient, patient verbalized understanding.

## 2024-02-25 NOTE — Progress Notes (Signed)
 02/25/2024  Johnny Johnny [id number] 13-May-1966  CARE TEAM: PCP: Dayna Motto, DO  Outpatient Care Team: Patient Care Team: Dayna Motto, DO as PCP - General (Family Medicine) Johnny Johnny, Johnny Johnny as Consulting Physician (Gastroenterology) Carlie Mills, Johnny Johnny as Consulting Physician (Otolaryngology) Johnny Johnny, Johnny Johnny as Consulting Physician (General Surgery) Johnny Johnny, Johnny Johnny as Consulting Physician (Gastroenterology)  Inpatient Treatment Team: Treatment Team:  Johnny Johnny, Johnny Johnny, Johnny Johnny, Johnny Arco, RN Johnny Johnny, Johnny Johnny, Johnny Johnny Johnny Hunter DEL, RN   Problem List:   Principal Problem:   Incarcerated hiatal hernia Active Problems:   Generalized anxiety disorder   Barretts esophagus   Gastroesophageal reflux disease without esophagitis   Polysubstance (including opioids) dependence w/o physiol dependence (HCC)   02/24/2024  POST-OPERATIVE DIAGNOSIS:  PARAESOPHAGEAL HIATAL HERNIA REFRACTORY TO MEDICAL MANAGEMENT   PROCEDURE:    1. ROBOTIC reduction of paraesophageal hiatal hernia 2. Type II mediastinal dissection. 3. Bilateral crural release 4. Primary repair of hiatal hernia.  5. Anterior & posterior gastropexy. 6. Nissen (360 degree x 2cm over a Bougie)  fundoplication 7. Mesh reinforcement with absorbable mesh 8.  Partial omentectomy   SURGEON:  Johnny Johnny, Johnny Johnny  OR FINDINGS:   Moderate-sized paraesophageal hiatal defect incarcerated with 80% of the stomach in the mediastinum.  Moderate volume of omentum as well.  There was a 10 x 8 cm hiatal defect.   It is a primary repair over pledgets posteriorly and primary suture only anteriorly.  Mesh reinforcement was used with GORE BIO-A mesh, a biosynthetic web scaffold made of 67% polyglycolic acid (PGA): 33% trimethylene carbonate (TMC).   The patient has a Nissen (360 degree x 2cm over a Bougie)  fundoplication.  The patient has had anterior and posterior gastropexy.    Assessment [facility]  Stay = 1 days) 1 Day Post-Op    Recovering rather well so far    Plan:  Clear liquids for now.  Esophagram this morning.  If no leak/obstruction, then advance to dysphagia 1 pured diet.  Keep on the dry side.  Med lock.  Lasix  x 1.  IV fluid boluses for backup if gets dehydrated.  No elevated creatinine or other concerns reassuring.  History of polysubstance abuse in recovery trying to avoid narcotics.  Seems to be doing well with multimodal nonnarcotic pain control so far.  Continue PPI for GERD Barrett's esophagus in the setting of giant incarcerated hiatal hernia now repaired  Keep surgical drain for now.  Possibly remove on day of discharge if low output and doing well.  -monitor electrolytes & replace as needed  Keep Mills>4, Mg>2, Phos>3  -VTE prophylaxis- SCDs.  Anticoagulation prophyllaxis SQ as appropriate  -mobilize as tolerated to help recovery.  Enlist therapies in moderate/high risk patients as appropriate  I updated the patient's status to the patient  Recommendations were made.  Questions were answered.  He expressed understanding & appreciation.  -Disposition: If he does well and tolerates pured diet, may be able to leave home later today.  Otherwise may need an extra day or 2 more steroids.  We will see. Disposition:  The patient is from: Home Anticipate discharge to:  Home Anticipated Date of Discharge is:  September 26,2025   Barriers to discharge:  Pending Clinical improvement (more likely than not) and Need for inpatient procedure/study  Patient currently is NOT MEDICALLY STABLE for discharge from the Johnny from a surgery standpoint.      I reviewed nursing notes, last 24 h vitals and pain scores, last 48  h intake and output, last 24 h labs and trends, and last 24 h imaging results.  I have reviewed this patient's available data, including medical history, events of note, test results, etc as part of my evaluation.   A significant portion of that  time was spent in counseling. Care during the described time interval was provided by me.  This care required moderate level of medical decision making.  02/25/2024    Subjective: (Chief complaint)  Feels good Denies pain Some belching  Objective:  Vital signs:  Vitals:   02/24/24 1708 02/24/24 2103 02/25/24 0146 02/25/24 0503  BP: 116/84 121/73 106/67 115/64  Pulse: 84 67 65 68  Resp: 18 16 15 15   Temp: 98.6 F (37 C) 98.4 F (36.9 C) 98.5 F (36.9 C) 98.7 F (37.1 C)  TempSrc: Oral Oral Oral Oral  SpO2: 98% 95% 93% 90%  Weight:      Height:        Last BM Date : 02/23/24  Intake/Output   Yesterday:  09/24 0701 - 09/25 0700 In: 2212.2 [I.V.:2212.2] Out: 30 [Blood:30] This shift:  No intake/output data recorded.  Bowel function:  Flatus: No  BM:  No  Drain: Serosanguinous   Physical Exam:  General: Pt awake/alert in no acute distress Eyes: PERRL, normal EOM.  Sclera clear.  No icterus Neuro: CN II-XII intact w/o focal sensory/motor deficits. Lymph: No head/neck/groin lymphadenopathy Psych:  No delerium/psychosis/paranoia.  Oriented x 4 HENT: Normocephalic, Mucus membranes moist.  No thrush Neck: Supple, No tracheal deviation.  No obvious thyromegaly Chest: No pain to chest wall compression.  Good respiratory excursion.  No audible wheezing CV:  Pulses intact.  Regular rhythm.  No major extremity edema MS: Normal AROM mjr joints.  No obvious deformity  Abdomen: Soft.  Mildy distended.  Nontender.  No evidence of peritonitis.  No incarcerated hernias.  Ext:  No deformity.  No mjr edema.  No cyanosis Skin: No petechiae / purpurea.  No major sores.  Warm and dry    Results:   Cultures: No results found for this or any previous visit (from the past 720 hours).  Labs: No results found for this or any previous visit (from the past 48 hours).  Imaging / Studies: No results found.  Medications / Allergies: per  chart  Antibiotics: Anti-infectives (From admission, onward)    Start     Dose/Rate Route Frequency Ordered Stop   02/24/24 0600  clindamycin  (CLEOCIN ) IVPB 900 mg       Placed in And Linked Group   900 mg 100 mL/hr over 30 Minutes Intravenous On call to O.R. 02/23/24 [id number] 02/24/24 0825   02/24/24 0600  gentamicin  (GARAMYCIN ) 410 mg in dextrose  5 % 100 mL IVPB       Placed in And Linked Group   5 mg/kg  82.5 kg (Adjusted) 110.3 mL/hr over 60 Minutes Intravenous On call to O.R. 02/23/24 [id number] 02/24/24 0857         Note: Portions of this report may have been transcribed using voice recognition software. Every effort was made to ensure accuracy; however, inadvertent computerized transcription errors may be present.   Any transcriptional errors that result from this process are unintentional.    Elspeth KYM Schultze, Johnny Johnny, FACS, MASCRS Esophageal, Gastrointestinal & Colorectal Surgery Robotic and Minimally Invasive Surgery  [facility] [address], [address] [place], [place] [address] [phone] Fax [phone] Main  CONTACT INFORMATION: Weekday (9AM-5PM): Call  [facility] main office at [phone] Weeknight (5PM-9AM) or Weekend/Holiday: Check EPIC Web Links tab & use AMION (password  [email or web address]) for General Surgery [facility] coverage  Please, DO NOT use SecureChat  (it is not reliable communication to reach operating surgeons & will lead to a delay in care).   Epic staff messaging available for outptient concerns needing 1-2 business day response.      02/25/2024  7:44 AM

## 2024-02-25 NOTE — Progress Notes (Signed)
   02/25/24 0826  TOC Brief Assessment  Insurance and Status Reviewed  Patient has primary care physician Yes  Home environment has been reviewed home with spouse  Prior level of function: independent  Prior/Current Home Services No current home services  Social Drivers of Health Review SDOH reviewed no interventions necessary  Readmission risk has been reviewed Yes  Transition of care needs no transition of care needs at this time

## 2024-02-25 NOTE — Plan of Care (Signed)
   Problem: Coping: Goal: Level of anxiety will decrease Outcome: Progressing

## 2024-02-25 NOTE — Discharge Summary (Signed)
 Physician Discharge Summary    Johnny Mills MRN: [id number] DOB/AGE: 12/11/1965 = 58 y.o.  Patient Care Team: Dayna Motto, DO as PCP - General (Family Medicine) Burnette Fallow, MD as Consulting Physician (Gastroenterology) Carlie Clark, MD as Consulting Physician (Otolaryngology) Sheldon Standing, MD as Consulting Physician (General Surgery) Burnette Fallow, MD as Consulting Physician (Gastroenterology)  Admit date: 02/24/2024  Discharge date: 02/25/2024  Hospital Stay = 1 days    Discharge Diagnoses:  Principal Problem:   Incarcerated hiatal hernia Active Problems:   Generalized anxiety disorder   Barretts esophagus   Gastroesophageal reflux disease without esophagitis   Polysubstance (including opioids) dependence w/o physiol dependence (HCC)   1 Day Post-Op  02/24/2024  POST-OPERATIVE DIAGNOSIS:  PARAESOPHAGEAL HIATAL HERNIA REFRACTORY TO MEDICAL MANAGEMENT    PROCEDURE:    1. ROBOTIC reduction of paraesophageal hiatal hernia 2. Type II mediastinal dissection. 3. Bilateral crural release 4. Primary repair of hiatal hernia.  5. Anterior & posterior gastropexy. 6. Nissen (360 degree x 2cm over a Bougie)  fundoplication 7. Mesh reinforcement with absorbable mesh 8.  Partial omentectomy   SURGEON:  Standing KYM Sheldon, MD   OR FINDINGS:   Moderate-sized paraesophageal hiatal defect incarcerated with 80% of the stomach in the mediastinum.  Moderate volume of omentum as well.  There was a 10 x 8 cm hiatal defect.   It is a primary repair over pledgets posteriorly and primary suture only anteriorly.  Mesh reinforcement was used with GORE BIO-A mesh, a biosynthetic web scaffold made of 67% polyglycolic acid (PGA): 33% trimethylene carbonate (TMC).   The patient has a Nissen (360 degree x 2cm over a Bougie)  fundoplication.  The patient has had anterior and posterior gastropexy.  Consults: Pharmacy, Nutrition, and Anesthesia  Hospital Course:   The patient underwent  the surgery above.  Postop day 1 esophagram showed no leak or significant obstruction.  Tolerating clears.  Therefore patient advanced to a dysphagia 1 pured diet.  Pain and other symptoms were treated aggressively.    By the time of discharge, the patient was walking well the hallways, eating food, having flatus.  Pain was well-controlled on an oral medications.  Based on meeting discharge criteria and continuing to recover, I felt it was safe for the patient to be discharged from the hospital to further recover with close followup. Postoperative recommendations were discussed in detail.  They are written as well.  Discharged Condition: good  Discharge Exam: Blood pressure 125/82, pulse 63, temperature 98.4 F (36.9 C), temperature source Oral, resp. rate 18, height 5' 11 (1.803 m), weight 93.4 kg, SpO2 94%.  General: Pt awake/alert/oriented x4 in No acute distress Eyes: PERRL, normal EOM.  Sclera clear.  No icterus Neuro: CN II-XII intact w/o focal sensory/motor deficits. Lymph: No head/neck/groin lymphadenopathy Psych:  No delerium/psychosis/paranoia HENT: Normocephalic, Mucus membranes moist.  No thrush Neck: Supple, No tracheal deviation Chest: No chest wall pain w good excursion CV:  Pulses intact.  Regular rhythm MS: Normal AROM mjr joints.  No obvious deformity Abdomen: Soft.  Nondistended.  Nontender  No evidence of peritonitis.  No incarcerated hernias. Ext:  SCDs BLE.  No mjr edema.  No cyanosis Skin: No petechiae / purpura   Disposition:    Follow-up Information     Sheldon Standing, MD Follow up in 3 week(s).   Specialties: General Surgery, Colon and Rectal Surgery Why: To follow up after your operation                Discharge  disposition: 01-Home or Self Care       Discharge Instructions     Call MD for:   Complete by: As directed    Temperature > 101.65F   Call MD for:  extreme fatigue   Complete by: As directed    Call MD for:  hives   Complete  by: As directed    Call MD for:  persistant nausea and vomiting   Complete by: As directed    Call MD for:  redness, tenderness, or signs of infection (pain, swelling, redness, odor or green/yellow discharge around incision site)   Complete by: As directed    Call MD for:  severe uncontrolled pain   Complete by: As directed    Diet general   Complete by: As directed    SEE ESOPHAGEAL SURGERY DIET INSTRUCTIONS  We using usually start you out on a pureed (blenderized) diet. Expect some sticking with swallowing over the next 1-2 months.   This is due to swelling around your esophagus at the wrap & hiatal diaphragm repair.  It will gradually ease off over the next few months.   Discharge instructions   Complete by: As directed    Please see discharge instruction sheets.   Also refer to any handouts/printouts that may have been given from the [facility] surgery office (if you visited us  there before surgery) Please call our office if you have any questions or concerns [phone]   Driving Restrictions   Complete by: As directed    No driving until off narcotics and can safely swerve away without pain during an emergency   Increase activity slowly   Complete by: As directed    Lifting restrictions   Complete by: As directed    Avoid heavy lifting initially, <20 pounds at first.   Do not push through pain.   You have no specific weight limit: If it hurts to do, DON'T DO IT.    If you feel no pain, you are not injuring anything.  Pain will protect you from injury.   Coughing and sneezing are far more stressful to your incision than any lifting.   Avoid resuming heavy lifting (>50 pounds) or other intense activity until off all narcotic pain medications.   When want to exercise more, give yourself 2 weeks to gradually get back to full intense exercise/activity.   May shower / Bathe   Complete by: As directed    SHOWER EVERY DAY.  It is fine for dressings or wounds to be washed/rinsed.  Use  gentle soap & water.  This will help the incisions and/or wounds get clean & minimize infection.   May walk up steps   Complete by: As directed    Remove dressing in 48 hours   Complete by: As directed    Sexual Activity Restrictions   Complete by: As directed    Sexual activity as tolerated.  Do not push through pain.  Pain will protect you from injury.   Walk with assistance   Complete by: As directed    Walk over an hour a day.  May use a walker/cane/companion to help with balance and stamina.       Allergies as of 02/25/2024       Reactions   Dilaudid  [hydromorphone ] Other (See Comments)   History of addiction - wishes to avoid   Duloxetine Other (See Comments)   Duloxetine Hcl Other (See Comments)   fatigue   Escitalopram Other (See Comments)   Fluoxetine Other (  See Comments)   fatigue   Oxycodone Other (See Comments)   History of addiction - wishes to avoid   Penicillamine Other (See Comments)   Penicillins Hives, Other (See Comments)   Sulfa Antibiotics Hives        Medication List     TAKE these medications    buPROPion  150 MG 24 hr tablet Commonly known as: WELLBUTRIN  XL Take 150 mg by mouth every morning.   cyclobenzaprine  5 MG tablet Commonly known as: FLEXERIL  Take 1 tablet (5 mg total) by mouth 3 (three) times daily as needed for muscle spasms.   folic acid  1 MG tablet Commonly known as: FOLVITE  Take 1 mg by mouth daily.   gabapentin  300 MG capsule Commonly known as: NEURONTIN  Take 1 capsule (300 mg total) by mouth 2 (two) times daily. Increase to 4x/day as needed   ondansetron  4 MG tablet Commonly known as: ZOFRAN  Take 1 tablet (4 mg total) by mouth every 8 (eight) hours as needed for nausea.   pantoprazole  40 MG tablet Commonly known as: PROTONIX  Take 40 mg by mouth daily.   SAW PALMETTO PO Take 1 Dose by mouth daily.        Significant Diagnostic Studies:  No results found for this or any previous visit (from the past 72  hours).  DG ESOPHAGUS W SINGLE CM (SOL OR THIN BA) Result Date: 02/25/2024 CLINICAL DATA:  Provided history: Status post laparoscopic fundoplication. Additional history obtained from electronic MEDICAL RECORD NUMBERStatus post robotic reduction of paraesophageal hiatal hernia and Nissen fundoplication. EXAM: ESOPHOGRAM/BARIUM SWALLOW TECHNIQUE: A single contrast examination was performed using water-soluble contrast. FLUOROSCOPY: Radiation Exposure Index (as provided by the fluoroscopic device): 47.60 mGy Kerma COMPARISON:  Upper GI series 07/08/2021. FINDINGS: A problem-oriented water-soluble esophagram was performed to assess for leak or obstruction status post paraesophageal hiatal hernia reduction and Nissen fundoplication. Mildly delayed passage of contrast from the distal esophagus into the stomach, possibly due to post-operative edema. Narrowing at the level of the distal esophagus/gastroesophageal junction consistent with the provided history of Nissen fundoplication. No extraluminal contrast demonstrated to suggest a post-operative leak. IMPRESSION: 1. No evidence of post-operative leak status post paraesophageal hiatal hernia reduction and Nissen fundoplication. 2. Mildly delayed passage of contrast from the distal esophagus into the stomach, possibly due to post-operative edema. Electronically Signed   By: Rockey Childs D.O.   On: 02/25/2024 09:24    Past Medical History:  Diagnosis Date   Anxiety    Depression    GERD (gastroesophageal reflux disease)    History of hiatal hernia    Substance abuse (HCC)     Past Surgical History:  Procedure Laterality Date   COLONOSCOPY     ESOPHAGOGASTRODUODENOSCOPY N/A 11/10/2023   Procedure: EGD (ESOPHAGOGASTRODUODENOSCOPY);  Surgeon: Saintclair Jasper, MD;  Location: [facility];  Service: Gastroenterology;  Laterality: N/A;   FRACTURE SURGERY     XI ROBOTIC ASSISTED PARAESOPHAGEAL HERNIA REPAIR N/A 02/24/2024   Procedure: REPAIR, HERNIA, PARAESOPHAGEAL,  ROBOT-ASSISTED;  Surgeon: Sheldon Standing, MD;  Location: [facility];  Service: General;  Laterality: N/A;    Social History   Socioeconomic History   Marital status: Married    Spouse name: Not on file   Number of children: Not on file   Years of education: Not on file   Highest education level: Not on file  Occupational History   Not on file  Tobacco Use   Smoking status: Former   Smokeless tobacco: Never  Vaping Use   Vaping status:  Some Days  Substance and Sexual Activity   Alcohol use: Not Currently   Drug use: Not Currently   Sexual activity: Not on file  Other Topics Concern   Not on file  Social History Narrative   Not on file   Social Drivers of Health   Financial Resource Strain: Not on file  Food Insecurity: No Food Insecurity (02/24/2024)   Hunger Vital Sign    Worried About Running Out of Food in the Last Year: Never true    Ran Out of Food in the Last Year: Never true  Transportation Needs: No Transportation Needs (02/24/2024)   PRAPARE - Administrator, Civil Service (Medical): No    Lack of Transportation (Non-Medical): No  Physical Activity: Not on file  Stress: Not on file  Social Connections: Not on file  Intimate Partner Violence: Not At Risk (02/24/2024)   Humiliation, Afraid, Rape, and Kick questionnaire    Fear of Current or Ex-Partner: No    Emotionally Abused: No    Physically Abused: No    Sexually Abused: No    History reviewed. No pertinent family history.  Current Facility-Administered Medications  Medication Dose Route Frequency Provider Last Rate Last Admin   acetaminophen  (TYLENOL ) tablet 1,000 mg  1,000 mg Oral Q6H Sheldon Standing, MD   1,000 mg at 02/25/24 1022   bisacodyl  (DULCOLAX) suppository 10 mg  10 mg Rectal Daily PRN Sheldon Standing, MD       buPROPion  (WELLBUTRIN ) tablet 75 mg  75 mg Oral BID Sheldon Standing, MD   75 mg at 02/25/24 9191   Chlorhexidine  Gluconate Cloth 2 % PADS 6 each  6 each Topical Once Sheldon Standing, MD        cyclobenzaprine  (FLEXERIL ) tablet 5-10 mg  5-10 mg Oral TID PRN Sheldon Standing, MD       dexamethasone  (DECADRON ) injection 8 mg  8 mg Intravenous Q12H Yaniyah Koors, MD   8 mg at 02/25/24 9191   diphenhydrAMINE  (BENADRYL ) 12.5 MG/5ML elixir 12.5 mg  12.5 mg Oral Q6H PRN Sheldon Standing, MD       Or   diphenhydrAMINE  (BENADRYL ) injection 12.5 mg  12.5 mg Intravenous Q6H PRN Sheldon Standing, MD       enoxaparin  (LOVENOX ) injection 40 mg  40 mg Subcutaneous Q24H Sheldon Standing, MD   40 mg at 02/25/24 9191   folic acid  (FOLVITE ) tablet 1 mg  1 mg Oral Daily Sheldon Standing, MD   1 mg at 02/25/24 9192   gabapentin  (NEURONTIN ) capsule 300 mg  300 mg Oral BID Sheldon Standing, MD   300 mg at 02/25/24 9192   influenza vac split trivalent PF (FLUZONE ) injection 0.5 mL  0.5 mL Intramuscular Tomorrow-1000 Sheldon Standing, MD       iohexol  (OMNIPAQUE ) 300 MG/ML solution 50 mL  50 mL Oral Once PRN Sheldon Standing, MD   125 mL at 02/25/24 0850   ketorolac  (TORADOL ) 15 MG/ML injection 15-30 mg  15-30 mg Intravenous Q6H PRN Sheldon Standing, MD   15 mg at 02/24/24 2220   lactated ringers  bolus 1,000 mL  1,000 mL Intravenous Q8H PRN Sheldon Standing, MD       magic mouthwash  15 mL Oral QID PRN Sheldon Standing, MD       magnesium  hydroxide (MILK OF MAGNESIA) suspension 30 mL  30 mL Oral Daily PRN Sheldon Standing, MD       menthol  (CEPACOL) lozenge 3 mg  1 lozenge Oral PRN Sheldon Standing, MD  methocarbamol  (ROBAXIN ) injection 1,000 mg  1,000 mg Intravenous Q6H PRN Sheldon Standing, MD       metoCLOPramide  (REGLAN ) injection 5-10 mg  5-10 mg Intravenous Q8H PRN Sheldon Standing, MD       metoCLOPramide  (REGLAN ) tablet 5-10 mg  5-10 mg Oral Q8H PRN Sheldon Standing, MD       metoprolol  tartrate (LOPRESSOR ) injection 5 mg  5 mg Intravenous Q6H PRN Sheldon Standing, MD       naphazoline-glycerin  (CLEAR EYES REDNESS) ophth solution 1-2 drop  1-2 drop Both Eyes QID PRN Sheldon Standing, MD       ondansetron  (ZOFRAN -ODT) disintegrating tablet 4 mg   4 mg Oral Q6H PRN Sheldon Standing, MD       Or   ondansetron  (ZOFRAN ) injection 4 mg  4 mg Intravenous Q6H PRN Sheldon Standing, MD       pantoprazole  (PROTONIX ) EC tablet 40 mg  40 mg Oral Daily Sheldon Standing, MD   40 mg at 02/25/24 9192   phenol (CHLORASEPTIC) mouth spray 2 spray  2 spray Mouth/Throat PRN Sheldon Standing, MD       prochlorperazine  (COMPAZINE ) tablet 10 mg  10 mg Oral Q6H PRN Sheldon Standing, MD       Or   prochlorperazine  (COMPAZINE ) injection 5-10 mg  5-10 mg Intravenous Q6H PRN Sheldon Standing, MD       simethicone  (MYLICON) chewable tablet 80 mg  80 mg Oral QID Sheldon Standing, MD   80 mg at 02/25/24 9192   sodium chloride  (OCEAN) 0.65 % nasal spray 1-2 spray  1-2 spray Each Nare Q6H PRN Sheldon Standing, MD         Allergies  Allergen Reactions   Dilaudid  [Hydromorphone ] Other (See Comments)    History of addiction - wishes to avoid   Duloxetine Other (See Comments)   Duloxetine Hcl Other (See Comments)    fatigue   Escitalopram Other (See Comments)   Fluoxetine Other (See Comments)    fatigue   Oxycodone Other (See Comments)    History of addiction - wishes to avoid   Penicillamine Other (See Comments)   Penicillins Hives and Other (See Comments)   Sulfa Antibiotics Hives    Signed:   Standing KYM Sheldon, MD, FACS, MASCRS Esophageal, Gastrointestinal & Colorectal Surgery Robotic and Minimally Invasive Surgery  [facility] [address], [address] [place], [place] [address] [phone] Fax [phone] Main  CONTACT INFORMATION: Weekday (9AM-5PM): Call [facility] main office at [phone] Weeknight (5PM-9AM) or Weekend/Holiday: Check EPIC Web Links tab & use AMION (password  [email or web address]) for General Surgery [facility] coverage  Please, DO NOT use SecureChat  (it is not reliable communication to reach operating surgeons & will lead to a delay in care).   Epic staff messaging available for outptient concerns needing 1-2  business day response.      02/25/2024, 1:20 PM
